# Patient Record
Sex: Female | Born: 1985 | Race: White | Hispanic: No | Marital: Married | Smoking: Never smoker
Health system: Southern US, Community
[De-identification: ages and names within clinical notes are randomized; demographics above are authoritative.]

## PROBLEM LIST (undated history)

## (undated) DIAGNOSIS — B009 Herpesviral infection, unspecified: Secondary | ICD-10-CM

## (undated) DIAGNOSIS — R87629 Unspecified abnormal cytological findings in specimens from vagina: Secondary | ICD-10-CM

## (undated) HISTORY — DX: Herpesviral infection, unspecified: B00.9

## (undated) HISTORY — DX: Unspecified abnormal cytological findings in specimens from vagina: R87.629

---

## 2019-09-20 ENCOUNTER — Other Ambulatory Visit: Payer: Self-pay

## 2019-09-20 ENCOUNTER — Ambulatory Visit (INDEPENDENT_AMBULATORY_CARE_PROVIDER_SITE_OTHER): Payer: BC Managed Care – PPO | Admitting: Family Medicine

## 2019-09-20 ENCOUNTER — Encounter: Payer: Self-pay | Admitting: Family Medicine

## 2019-09-20 ENCOUNTER — Ambulatory Visit: Payer: Self-pay

## 2019-09-20 DIAGNOSIS — M25562 Pain in left knee: Secondary | ICD-10-CM

## 2019-09-20 NOTE — Progress Notes (Signed)
   Office Visit Note   Patient: Regina Larsen           Date of Birth: [birth year]           MRN: [id number] Visit Date: 09/20/2019 Requested by: No referring provider defined for this encounter. PCP: No primary care provider on file.  Subjective: Chief Complaint  Patient presents with  . Left Knee - Pain    DOI 08/04/19 Felt a pop in the knee while doing a twisting motion in [employer] jiujitsu class. 24 hours later, she had trouble flexing the knee due to swelling. Been going to [facility]. They referred her here to be sure she didn't tear anything.    HPI: He is seen at the request of Aart Schulenklopper for left knee pain.  Roughly 6 weeks ago he was doing jujitsu class and had her weight planted on her left leg, she pivoted around and felt something pop in her left knee.  It hurt but not severely, she tried to do a little bit more in class but the knee gave out twice so she stopped.  Over the next day it swelled but not severely.  After couple weeks she was improving but still could not fully extend or fully flex her knee compared to the right.  She started going to physical therapy and is improving from a pain standpoint, but still not back to normal.  Her therapist was concerned about possible ACL tear so she now presents for evaluation.  No previous problems with her knee.  She is otherwise been in good health.              ROS: No fevers.  All other systems were reviewed and are negative.  Objective: Vital Signs: There were no vitals taken for this visit.  Physical Exam:  General:  Alert and oriented, in no acute distress. Pulm:  Breathing unlabored. Psy:  Normal mood, congruent affect. Skin:  No bruising.  Left knee: Trace effusion, no warmth.  She lacks about 5 degrees from full extension compared to the right knee.  Flexion is limited to just past 90 degrees.  No patellofemoral crepitus, negative patellar apprehension test.  Lockman's is difficult, but she seems to have laxity  with anterior drawer compared to the right.  She is also tender on the medial joint line with pain but no palpable click on McMurray's.  Imaging: X-rays left knee: Well-preserved joint spaces, no Segond fracture.   Assessment & Plan: 1.  6 weeks status post left knee injury with possible ACL tear, medial meniscus tear -MRI to evaluate.     Procedures: No procedures performed  No notes on file     PMFS History: There are no active problems to display for this patient.  History reviewed. No pertinent past medical history.  History reviewed. No pertinent family history.  History reviewed. No pertinent surgical history. Social History   Occupational History  . Not on file  Tobacco Use  . Smoking status: Not on file  Substance and Sexual Activity  . Alcohol use: Not on file  . Drug use: Not on file  . Sexual activity: Not on file

## 2019-09-30 ENCOUNTER — Other Ambulatory Visit: Payer: Self-pay

## 2019-09-30 ENCOUNTER — Other Ambulatory Visit: Payer: Self-pay | Admitting: Family Medicine

## 2019-09-30 ENCOUNTER — Ambulatory Visit
Admission: RE | Admit: 2019-09-30 | Discharge: 2019-09-30 | Disposition: A | Discharge status: Home / Self Care | Payer: BC Managed Care – PPO | Source: Ambulatory Visit | Attending: Family Medicine | Admitting: Family Medicine

## 2019-09-30 DIAGNOSIS — M25562 Pain in left knee: Secondary | ICD-10-CM

## 2019-10-03 ENCOUNTER — Telehealth: Payer: Self-pay | Admitting: Family Medicine

## 2019-10-03 NOTE — Telephone Encounter (Signed)
Advised the patient of her MRI results. Scheduled an appointment with Dr. Marlou Sa for surgical consult on 10/10/19 at 1:45.

## 2019-10-03 NOTE — Telephone Encounter (Signed)
Knee MRI scan shows intact meniscus cartilages, but the ACL appears damaged.  I recommend consultation with Dr. Marlou Sa to discuss surgical options.  Please schedule consult.

## 2019-10-03 NOTE — Telephone Encounter (Signed)
Left message on patient's home voice mail to call back for MRI results.

## 2019-10-05 ENCOUNTER — Other Ambulatory Visit: Payer: BC Managed Care – PPO

## 2019-10-10 ENCOUNTER — Ambulatory Visit (INDEPENDENT_AMBULATORY_CARE_PROVIDER_SITE_OTHER): Payer: BC Managed Care – PPO | Admitting: Orthopedic Surgery

## 2019-10-10 ENCOUNTER — Other Ambulatory Visit: Payer: Self-pay

## 2019-10-10 VITALS — Ht 64.5 in | Wt 230.0 lb

## 2019-10-10 DIAGNOSIS — S83512D Sprain of anterior cruciate ligament of left knee, subsequent encounter: Secondary | ICD-10-CM | POA: Diagnosis not present

## 2019-10-11 ENCOUNTER — Encounter: Payer: Self-pay | Admitting: Orthopedic Surgery

## 2019-10-11 NOTE — Progress Notes (Signed)
Office Visit Note   Patient: Regina Larsen           Date of Birth: [birth year]           MRN: [id number] Visit Date: 10/10/2019 Requested by: No referring provider defined for this encounter. PCP: Patient, No Pcp Per  Subjective: Chief Complaint  Patient presents with   Left Knee - Pain    HPI: Regina Larsen is a patient with left knee pain and instability.  She has seen Dr. Junius Roads in the past.  She is had an MRI scan.  MRI scan is reviewed and she has intact menisci but does have what appears to be ACL tear and degeneration.  She describes symptomatic instability since her injury several months ago.  She had the initial injury and it swelled about 24 hours later.  She does jujitsu.  Went to physical therapy but had continued episodes of instability when she tried returning to [employer].  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  No personal or family history of DVT or pulmonary embolism              ROS: All systems reviewed are negative as they relate to the chief complaint within the history of present illness.  Patient denies  fevers or chills.   Assessment & Plan: Visit Diagnoses:  1. Rupture of anterior cruciate ligament of left knee, subsequent encounter     Plan: Impression is left knee pain ACL instability following an injury with failure of conservative management in a patient who wants to be very active in mixed martial arts.  Discussed with her operative and nonoperative treatment options today.  Operative treatment options would include ACL reconstruction using autograft hamstring bone patella tendon bone or quad tendon.  She does spend a lot of time on her knees in the mixed martial arts and thus I think bone patella tendon bone would not be ideal.  Hamstring or quad tendon could also be effective for this.  Plan on autograft ACL reconstruction.  Risk benefits are discussed include not limited to infection nerve vessel damage knee  stiffness incomplete restoration of motion and function.  Patient understands the 65-month rehab time to get back to straightahead running and 22-month return to cutting and pivoting activities.  All questions answered.  Follow-Up Instructions: No follow-ups on file.   Orders:  No orders of the defined types were placed in this encounter.  No orders of the defined types were placed in this encounter.     Procedures: No procedures performed   Clinical Data: No additional findings.  Objective: Vital Signs: Ht 5' 4.5" (1.638 m)    Wt 230 lb (104.3 kg)    BMI 38.87 kg/m   Physical Exam:   Constitutional: Patient appears well-developed HEENT:  Head: Normocephalic Eyes:EOM are normal Neck: Normal range of motion Cardiovascular: Normal rate Pulmonary/chest: Effort normal Neurologic: Patient is alert Skin: Skin is warm Psychiatric: Patient has normal mood and affect    Ortho Exam: Ortho exam demonstrates increased anterior laxity with Lachman and anterior drawer left versus right.  Patient lacks about 5 degrees of full extension on the left compared to the right which does hyperextend.  Collaterals are stable on the left and there is no posterior lateral rotatory instability.  No effusion in the left knee.  No focal joint line tenderness.  Extensor mechanism is intact and flexion is symmetric to about 130 in both  knees.  Specialty Comments:  No specialty comments available.  Imaging: No results found.   PMFS History: There are no problems to display for this patient.  History reviewed. No pertinent past medical history.  History reviewed. No pertinent family history.  History reviewed. No pertinent surgical history. Social History   Occupational History   Not on file  Tobacco Use   Smoking status: Not on file  Substance and Sexual Activity   Alcohol use: Not on file   Drug use: Not on file   Sexual activity: Not on file

## 2019-10-28 HISTORY — PX: KNEE ARTHROSCOPY W/ ACL RECONSTRUCTION: SHX1858

## 2019-11-07 ENCOUNTER — Other Ambulatory Visit: Payer: Self-pay | Admitting: Surgical

## 2019-11-07 ENCOUNTER — Encounter: Payer: Self-pay | Admitting: Orthopedic Surgery

## 2019-11-07 DIAGNOSIS — S83512A Sprain of anterior cruciate ligament of left knee, initial encounter: Secondary | ICD-10-CM | POA: Diagnosis not present

## 2019-11-07 MED ORDER — ASPIRIN EC 81 MG PO TBEC: 81.0000 mg | DELAYED_RELEASE_TABLET | Freq: Every day | ORAL | 0 refills | Status: DC

## 2019-11-07 MED ORDER — OXYCODONE HCL 5 MG PO TABS: 5.0000 mg | ORAL_TABLET | ORAL | 0 refills | Status: AC | PRN

## 2019-11-07 MED ORDER — METHOCARBAMOL 500 MG PO TABS: 500.0000 mg | ORAL_TABLET | Freq: Three times a day (TID) | ORAL | 0 refills | Status: DC | PRN

## 2019-11-08 ENCOUNTER — Telehealth: Payer: Self-pay

## 2019-11-08 NOTE — Telephone Encounter (Signed)
Please advise. Thanks.  

## 2019-11-08 NOTE — Telephone Encounter (Signed)
Patient would like to know if she is to wear the immobilizer when walking around or just when she is in bed? 2)Is it okay to take Tylenol while taking Oxycodone? 3)Inside of right calf still feels numb, like pins and needles.  Is this normal? 4)Pulsing above the left knee.  Is this normal? Patient had Left knee surgery on Monday, 11/07/2019.  Cb# [phone].  Please advise.  Thank you.

## 2019-11-09 NOTE — Telephone Encounter (Signed)
I called and sw pt to advise of message below. Voiced understanding and to call back with any questions.

## 2019-11-16 ENCOUNTER — Encounter: Payer: Self-pay | Admitting: Orthopedic Surgery

## 2019-11-16 ENCOUNTER — Ambulatory Visit (INDEPENDENT_AMBULATORY_CARE_PROVIDER_SITE_OTHER): Payer: BC Managed Care – PPO | Admitting: Orthopedic Surgery

## 2019-11-16 ENCOUNTER — Other Ambulatory Visit: Payer: Self-pay

## 2019-11-16 DIAGNOSIS — S83512D Sprain of anterior cruciate ligament of left knee, subsequent encounter: Secondary | ICD-10-CM

## 2019-11-18 ENCOUNTER — Encounter: Payer: Self-pay | Admitting: Orthopedic Surgery

## 2019-11-18 NOTE — Progress Notes (Signed)
   Post-Op Visit Note   Patient: Regina Larsen           Date of Birth: 01/10/1986           MRN: [id number] Visit Date: 11/16/2019 PCP: Patient, No Pcp Per   Assessment & Plan:  Chief Complaint:  Chief Complaint  Patient presents with  . Left Knee - Pain, Follow-up, Routine Post Op   Visit Diagnoses:  1. Rupture of anterior cruciate ligament of left knee, subsequent encounter     Plan: Patient is a 34 year old female who presents s/p left knee ACL reconstruction on 11/07/2019.  She is doing well and ambulating with a cane.  She has stopped taking her oxycodone and is only taking the prescribed aspirin for DVT prophylaxis and Tylenol for pain.  She has been using her knee immobilizer brace.  She is also been using the CPM machine as directed and is at 90 degrees.  She has not had any physical therapy yet.  Her stitches are intact and were removed today.  Her incisions are healing well.  She does have a small effusion but did not opt for aspiration.  No calf tenderness.  Negative Homans' sign.  ACL graft is stable on exam today.  Plan to begin physical therapy at [facility] physical therapy.  Follow-up in 2 weeks for clinical recheck.  Follow-Up Instructions: No follow-ups on file.   Orders:  No orders of the defined types were placed in this encounter.  No orders of the defined types were placed in this encounter.   Imaging: No results found.  PMFS History: There are no problems to display for this patient.  History reviewed. No pertinent past medical history.  History reviewed. No pertinent family history.  History reviewed. No pertinent surgical history. Social History   Occupational History  . Not on file  Tobacco Use  . Smoking status: Never Smoker  . Smokeless tobacco: Never Used  Substance and Sexual Activity  . Alcohol use: Not on file  . Drug use: Not on file  . Sexual activity: Not on file

## 2019-11-19 ENCOUNTER — Encounter: Payer: Self-pay | Admitting: Orthopedic Surgery

## 2019-11-29 ENCOUNTER — Other Ambulatory Visit: Payer: Self-pay | Admitting: Surgical

## 2019-11-29 NOTE — Telephone Encounter (Signed)
Ok to rf? 

## 2019-11-30 ENCOUNTER — Encounter: Payer: Self-pay | Admitting: Orthopedic Surgery

## 2019-11-30 ENCOUNTER — Ambulatory Visit (INDEPENDENT_AMBULATORY_CARE_PROVIDER_SITE_OTHER): Payer: BC Managed Care – PPO | Admitting: Orthopedic Surgery

## 2019-11-30 ENCOUNTER — Other Ambulatory Visit: Payer: Self-pay

## 2019-11-30 DIAGNOSIS — S83512D Sprain of anterior cruciate ligament of left knee, subsequent encounter: Secondary | ICD-10-CM

## 2019-12-02 ENCOUNTER — Encounter: Payer: Self-pay | Admitting: Orthopedic Surgery

## 2019-12-02 NOTE — Progress Notes (Signed)
   Post-Op Visit Note   Patient: Regina Larsen           Date of Birth: Mar 23, 1986           MRN: [id number] Visit Date: 11/30/2019 PCP: Patient, No Pcp Per   Assessment & Plan:  Chief Complaint:  Chief Complaint  Patient presents with  . Left Knee - Pain   Visit Diagnoses:  1. Rupture of anterior cruciate ligament of left knee, subsequent encounter     Plan: Patient is a 34 year old female who presents s/p left knee ACL reconstruction on 11/07/2019.  Patient notes that she is doing well overall.  She is going to physical therapy twice a week where they are working on balancing, calf strength, quad strength.  Her main complaint is that she cannot straighten her left knee all the way.  She has 105 degrees of flexion but only 10 degrees of extension.  She has a large effusion on exam.  Her graft is stable and her incisions are healing well.  She has discontinued her CPM machine.  She notes some pain but overall is doing well.  She is taking Advil as needed.  I am a little concerned about her lack of full extension at this time.  She does have a soft endpoint so I think that she can improve on this front.  Aspirated the left knee of about 33 cc of fluid.  Also patient was given a small heel lift for her right shoe which should force her to fully extend her leg when walking.  I also instructed patient on how to best work on knee extension.  Plan for return office visit in about 4 weeks for clinical recheck.  Follow-Up Instructions: No follow-ups on file.   Orders:  No orders of the defined types were placed in this encounter.  No orders of the defined types were placed in this encounter.   Imaging: No results found.  PMFS History: There are no problems to display for this patient.  History reviewed. No pertinent past medical history.  History reviewed. No pertinent family history.  History reviewed. No pertinent surgical history. Social History   Occupational History  . Not on file   Tobacco Use  . Smoking status: Never Smoker  . Smokeless tobacco: Never Used  Substance and Sexual Activity  . Alcohol use: Not on file  . Drug use: Not on file  . Sexual activity: Not on file

## 2019-12-10 ENCOUNTER — Ambulatory Visit: Payer: BC Managed Care – PPO

## 2019-12-13 ENCOUNTER — Other Ambulatory Visit: Payer: Self-pay | Admitting: Surgical

## 2019-12-13 NOTE — Telephone Encounter (Signed)
Pls advise. Thanks.  

## 2019-12-28 ENCOUNTER — Ambulatory Visit (INDEPENDENT_AMBULATORY_CARE_PROVIDER_SITE_OTHER): Payer: BC Managed Care – PPO | Admitting: Orthopedic Surgery

## 2019-12-28 ENCOUNTER — Other Ambulatory Visit: Payer: Self-pay

## 2019-12-28 DIAGNOSIS — S83512D Sprain of anterior cruciate ligament of left knee, subsequent encounter: Secondary | ICD-10-CM

## 2019-12-29 ENCOUNTER — Encounter: Payer: Self-pay | Admitting: Orthopedic Surgery

## 2019-12-29 NOTE — Progress Notes (Signed)
   Post-Op Visit Note   Patient: Regina Larsen           Date of Birth: Apr 29, 1986           MRN: [id number] Visit Date: 12/28/2019 PCP: Patient, No Pcp Per   Assessment & Plan:  Chief Complaint:  Chief Complaint  Patient presents with  . Left Knee - Routine Post Op   Visit Diagnoses:  1. Rupture of anterior cruciate ligament of left knee, subsequent encounter     Plan: Regina Larsen is a patient with left knee ACL reconstruction performed 1121.  Doing well.  Going to therapy twice a week working on full extension.  Having a lot of pain.  Doing the elliptical and bike.  On exam graft is stable.  She does lack about 3 degrees of full extension.  Right leg hyperextends.  Mild effusion is present.  Graft is stable.  2 months return in really just stay the course until then.  We could begin some cutting and pivoting and sport specific activities at that time.  No calf tenderness today.  Negative Homans  Follow-Up Instructions: Return in about 8 weeks (around 02/22/2020).   Orders:  No orders of the defined types were placed in this encounter.  No orders of the defined types were placed in this encounter.   Imaging: No results found.  PMFS History: There are no problems to display for this patient.  History reviewed. No pertinent past medical history.  History reviewed. No pertinent family history.  History reviewed. No pertinent surgical history. Social History   Occupational History  . Not on file  Tobacco Use  . Smoking status: Never Smoker  . Smokeless tobacco: Never Used  Substance and Sexual Activity  . Alcohol use: Not on file  . Drug use: Not on file  . Sexual activity: Not on file

## 2020-02-27 ENCOUNTER — Telehealth: Payer: Self-pay | Admitting: Orthopedic Surgery

## 2020-02-27 NOTE — Telephone Encounter (Signed)
Printout of charges emailed to patient

## 2020-02-29 ENCOUNTER — Ambulatory Visit (INDEPENDENT_AMBULATORY_CARE_PROVIDER_SITE_OTHER): Payer: BC Managed Care – PPO | Admitting: Orthopedic Surgery

## 2020-02-29 ENCOUNTER — Other Ambulatory Visit: Payer: Self-pay

## 2020-02-29 ENCOUNTER — Encounter: Payer: Self-pay | Admitting: Orthopedic Surgery

## 2020-02-29 DIAGNOSIS — S83512D Sprain of anterior cruciate ligament of left knee, subsequent encounter: Secondary | ICD-10-CM

## 2020-02-29 NOTE — Progress Notes (Signed)
   Post-Op Visit Note   Patient: Regina Larsen           Date of Birth: October 10, 1986           MRN: [id number] Visit Date: 02/29/2020 PCP: Patient, No Pcp Per   Assessment & Plan:  Chief Complaint:  Chief Complaint  Patient presents with  . Left Knee - Follow-up   Visit Diagnoses:  1. Rupture of anterior cruciate ligament of left knee, subsequent encounter     Plan: Patient is a 34 year old female presents s/p ACL reconstruction on 11/07/2019.  She is about 4 months out.  She is doing great and has no complaints of pain.  She is going to physical therapy 2 times a week where they are using blood flow restriction therapy as well as working on weight lifting, elliptical, running drills, proprioception.  She is not taking any medications for pain.  She does not wake up with pain at night.  She has no complaints of instability.  On exam she has 0 degrees of extension as well as flexion that is equivalent to the contralateral leg.  Her graft feels stable on exam.  She has no effusion.  Incisions of healed well.  She has excellent quadriceps and hamstring strength but her hamstring strength is not quite equivalent to the contralateral leg at this point.  She wants [place]  Recommend that she hold off on any cutting or pivoting or martial arts specific training at this time.  Will discuss return to sport at her next visit in 2 months.  Patient agrees with plan.  Follow-Up Instructions: No follow-ups on file.   Orders:  No orders of the defined types were placed in this encounter.  No orders of the defined types were placed in this encounter.   Imaging: No results found.  PMFS History: There are no problems to display for this patient.  No past medical history on file.  No family history on file.  No past surgical history on file. Social History   Occupational History  . Not on file  Tobacco Use  . Smoking status: Never Smoker  .  Smokeless tobacco: Never Used  Substance and Sexual Activity  . Alcohol use: Not on file  . Drug use: Not on file  . Sexual activity: Not on file

## 2020-05-02 ENCOUNTER — Ambulatory Visit (INDEPENDENT_AMBULATORY_CARE_PROVIDER_SITE_OTHER): Payer: BC Managed Care – PPO | Admitting: Orthopedic Surgery

## 2020-05-02 DIAGNOSIS — S83512D Sprain of anterior cruciate ligament of left knee, subsequent encounter: Secondary | ICD-10-CM

## 2020-05-06 ENCOUNTER — Encounter: Payer: Self-pay | Admitting: Orthopedic Surgery

## 2020-05-06 NOTE — Progress Notes (Signed)
Office Visit Note   Patient: Regina Larsen           Date of Birth: [birth year]           MRN: [id number] Visit Date: 05/02/2020 Requested by: No referring provider defined for this encounter. PCP: Patient, No Pcp Per  Subjective: Chief Complaint  Patient presents with  . Follow-up    HPI: Halima Fogal is a 34 y.o. female who presents to the office s/p left knee ACL reconstruction on 11/07/2019.  She is doing well with no complaints.  She is doing home exercise program and going to the gym.  She denies any instability or mechanical symptoms.  She does have occasional pain but nothing that requires taking any oral medication for.  Her main goal is to return to jujitsu..                ROS: All systems reviewed are negative as they relate to the chief complaint within the history of present illness.  Patient denies fevers or chills.  Assessment & Plan: Visit Diagnoses: No diagnosis found.  Plan: Patient is a 35 year old female who presents about 6 months out from left knee ACL reconstruction.  She is doing well with no problems.  She has had no instability events.  She does have occasional pain but nothing that is significant to her.  On exam she has no effusion and her graft is stable on Lachman exam.  She is still lacking some hamstring strength which is expected at this point.  Recommend she continue to work on her hamstring strengthening.  Plan to continue with home exercise program and gym workouts.  Hold off on any jujitsu for now.  Plan to see patient back in 3 months from today which would make her 45-month out from procedure.  If she is still doing well at that point she can ease her self into returning to jujitsu classes.  Patient agreed this plan.  Follow-up in 3 months.  Follow-Up Instructions: No follow-ups on file.   Orders:  No orders of the defined types were placed in this encounter.  No orders of the defined types were placed in this encounter.     Procedures: No  procedures performed   Clinical Data: No additional findings.  Objective: Vital Signs: There were no vitals taken for this visit.  Physical Exam:  Constitutional: Patient appears well-developed HEENT:  Head: Normocephalic Eyes:EOM are normal Neck: Normal range of motion Cardiovascular: Normal rate Pulmonary/chest: Effort normal Neurologic: Patient is alert Skin: Skin is warm Psychiatric: Patient has normal mood and affect  Ortho Exam: Ortho exam demonstrates left knee with excellent range of motion.  0 degrees extension.  Greater than 120 degrees of flexion.  No effusion is present.  No tenderness to palpation throughout the medial or lateral joint lines.  Incisions are well-healed.  Graft is stable on Lachman exam with a solid endpoint and no significant laxity.  She has equal strength between the right and left quadriceps muscles.  She is lacking some hamstring strength compared with the nonoperative side.  Specialty Comments:  No specialty comments available.  Imaging: No results found.   PMFS History: There are no problems to display for this patient.  No past medical history on file.  No family history on file.  No past surgical history on file. Social History   Occupational History  . Not on file  Tobacco Use  . Smoking status: Never Smoker  . Smokeless tobacco: Never Used  Substance and Sexual Activity  . Alcohol use: Not on file  . Drug use: Not on file  . Sexual activity: Not on file

## 2020-08-02 ENCOUNTER — Ambulatory Visit: Payer: BC Managed Care – PPO | Admitting: Orthopedic Surgery

## 2020-08-09 ENCOUNTER — Ambulatory Visit: Payer: Self-pay

## 2020-08-09 ENCOUNTER — Ambulatory Visit (INDEPENDENT_AMBULATORY_CARE_PROVIDER_SITE_OTHER): Payer: BC Managed Care – PPO | Admitting: Orthopedic Surgery

## 2020-08-09 DIAGNOSIS — S83512D Sprain of anterior cruciate ligament of left knee, subsequent encounter: Secondary | ICD-10-CM

## 2020-08-09 DIAGNOSIS — M79671 Pain in right foot: Secondary | ICD-10-CM | POA: Diagnosis not present

## 2020-08-11 NOTE — Progress Notes (Signed)
   Office Visit Note   Patient: Regina Larsen           Date of Birth: [birth year]           MRN: [id number] Visit Date: 08/09/2020 Requested by: No referring provider defined for this encounter. PCP: Patient, No Pcp Per  Subjective: Chief Complaint  Patient presents with  . Follow-up    HPI: Regina Larsen is a 35 y.o. female who presents to the office s/p left knee ACL reconstruction on 11/07/2019. Patient notes that she is doing well overall aside from some occasional "tightness" that bothers her in colder weather. She is going to the gym where she is working with a trainer for agility exercises as well as using stationary bike and doing some strength training exercises. She notes occasional giving way of the knee for a brief second but this is only happened 2-3 times since her last office visit and overall she feels that it is not a major concern. She does note some continued mild right foot pain over the last several months. She has been using stretching exercises to work on the plantar pain which has been helping but she also notes lateral sided pain around the fifth metatarsal base that has bothered her more in the last week. On exam her left knee is stable on Lachman exam without any effusion. She has excellent quadricep strength at about 85% hamstring strength of the contralateral side. Incisions of healed well.  Plan for patient to continue with her agility exercises and exercise regimen and she may start to gradually return to presenting jujitsu as she desires. Regarding the right foot, radiographs are negative for any acute pathology noted. Recommend she continue with anti-inflammatories and stretching exercises as it seems to be helping with her plantar foot pain. Follow-up as needed..                ROS: All systems reviewed are negative as they relate to the chief complaint within the history of present illness.  Patient denies fevers or chills.  Assessment & Plan: Visit Diagnoses:    1. Right foot pain   2. Rupture of anterior cruciate ligament of left knee, subsequent encounter     Plan: See above.  Follow-Up Instructions: No follow-ups on file.   Orders:  Orders Placed This Encounter  Procedures  . XR Foot Complete Right   No orders of the defined types were placed in this encounter.     Procedures: No procedures performed   Clinical Data: No additional findings.  Objective: Vital Signs: There were no vitals taken for this visit.  Physical Exam:  Constitutional: Patient appears well-developed HEENT:  Head: Normocephalic Eyes:EOM are normal Neck: Normal range of motion Cardiovascular: Normal rate Pulmonary/chest: Effort normal Neurologic: Patient is alert Skin: Skin is warm Psychiatric: Patient has normal mood and affect  Ortho Exam: See above.  Specialty Comments:  No specialty comments available.  Imaging: No results found.   PMFS History: There are no problems to display for this patient.  No past medical history on file.  No family history on file.  No past surgical history on file. Social History   Occupational History  . Not on file  Tobacco Use  . Smoking status: Never Smoker  . Smokeless tobacco: Never Used  Substance and Sexual Activity  . Alcohol use: Not on file  . Drug use: Not on file  . Sexual activity: Not on file

## 2020-08-12 ENCOUNTER — Encounter: Payer: Self-pay | Admitting: Orthopedic Surgery

## 2020-10-08 LAB — OB RESULTS CONSOLE RPR: RPR: NONREACTIVE

## 2020-10-08 LAB — OB RESULTS CONSOLE GC/CHLAMYDIA
Chlamydia: NEGATIVE
Gonorrhea: NEGATIVE

## 2020-10-08 LAB — OB RESULTS CONSOLE HEPATITIS B SURFACE ANTIGEN: Hepatitis B Surface Ag: NEGATIVE

## 2020-10-08 LAB — OB RESULTS CONSOLE ABO/RH: RH Type: POSITIVE

## 2020-10-08 LAB — OB RESULTS CONSOLE RUBELLA ANTIBODY, IGM: Rubella: IMMUNE

## 2020-10-08 LAB — OB RESULTS CONSOLE HIV ANTIBODY (ROUTINE TESTING): HIV: NONREACTIVE

## 2020-10-08 LAB — OB RESULTS CONSOLE ANTIBODY SCREEN: Antibody Screen: NEGATIVE

## 2020-10-27 NOTE — L&D Delivery Note (Signed)
PROCEDURE DATE: 05/07/21   PREOPERATIVE DIAGNOSIS: Preeclampsia with severe features, nonreassuring fetal status, vaginal bleeding, failed induction   POSTOPERATIVE DIAGNOSIS: The same   PROCEDURE:    Primary Low Transverse Cesarean Section   SURGEON:  Dr. Austin Lavere Stork   INDICATIONS: This is a 35 yo G1P0 at 40 wga that was being induced for preeclampsia with severe features (severe range BP).  She required multiple rounds of IV antihypertensives and was on magnesium infusion. She underwent 2 stage IOL and progressed slowly. Her labor course was complicated by nonreassuring fetal status.  Decision made to proceed with LTCS. The risks of cesarean section discussed with the patient included but were not limited to: bleeding which may require transfusion or reoperation; infection which may require antibiotics; injury to bowel, bladder, ureters or other surrounding organs; injury to the fetus; need for additional procedures including hysterectomy in the event of a life-threatening hemorrhage; placental abnormalities wth subsequent pregnancies, incisional problems, thromboembolic phenomenon and other postoperative/anesthesia complications. The patient agreed with the proposed plan, giving informed consent for the procedure.     FINDINGS:  Viable female infant in vertex presentation, occiput posterior, APGARs8, 9,  Weight pending, Amniotic fluid meconium stained,  Intact placenta, three vessel cord with known marginal cord insertion.  Grossly normal uterus. .   ANESTHESIA:    Epidural ESTIMATED BLOOD LOSS: 500 cc SPECIMENS: Placenta for pathology (possible abruption) COMPLICATIONS: None immediate    PROCEDURE IN DETAIL:  The patient received intravenous antibiotics (2g Ancef) and had sequential compression devices applied to her lower extremities while in the preoperative area.  She was then taken to the operating room where epidural anesthesia was dosed up to surgical level and was found to be  adequate. She was then placed in a dorsal supine position with a leftward tilt, and prepped and draped in a sterile manner.  A foley catheter was placed into her bladder and attached to constant gravity.  After an adequate timeout was performed, a Pfannenstiel skin incision was made with scalpel and carried through to the underlying layer of fascia. The fascia was incised in the midline and this incision was extended bilaterally using the Mayo scissors. Kocher clamps were applied to the superior aspect of the fascial incision and the underlying rectus muscles were dissected off bluntly. A similar process was carried out on the inferior aspect of the facial incision. The rectus muscles were separated in the midline bluntly and the peritoneum was entered bluntly.  A bladder flap was created sharply and developed bluntly. A transverse hysterotomy was made with a scalpel and extended bilaterally bluntly. The bladder blade was then removed. The infant was successfully delivered, and cord was clamped and cut and infant was handed over to awaiting neonatology team. Uterine massage was then administered and the placenta delivered intact with three-vessel cord. Cord gases were taken. The uterus was cleared of clot and debris.  The hysterotomy was closed with 0 vicryl. The fascia was closed with 0-PDS in a running fashion with good restoration of anatomy.  The subcutaneus tissue was irrigated and was reapproximated using running plain gut stitches.  The skin was closed with 4-0 Vicryl in a subcuticular fashion.  All surgical site and was hemostatic at end of procedure without any further bleeding on exam.    Pt tolerated the procedure well. All sponge/lap/needle counts were correct  X 2. Pt taken to recovery room in stable condition.   Austin Samay Delcarlo MD 

## 2021-01-07 ENCOUNTER — Encounter: Payer: Self-pay | Admitting: Obstetrics and Gynecology

## 2021-01-10 ENCOUNTER — Other Ambulatory Visit: Payer: Self-pay | Admitting: Obstetrics and Gynecology

## 2021-01-10 DIAGNOSIS — O43122 Velamentous insertion of umbilical cord, second trimester: Secondary | ICD-10-CM

## 2021-01-11 ENCOUNTER — Other Ambulatory Visit: Payer: Self-pay | Admitting: Obstetrics and Gynecology

## 2021-01-24 ENCOUNTER — Encounter: Payer: Self-pay | Admitting: *Deleted

## 2021-01-28 ENCOUNTER — Ambulatory Visit: Payer: BC Managed Care – PPO | Admitting: *Deleted

## 2021-01-28 ENCOUNTER — Encounter: Payer: Self-pay | Admitting: *Deleted

## 2021-01-28 ENCOUNTER — Ambulatory Visit: Payer: BC Managed Care – PPO | Attending: Obstetrics and Gynecology

## 2021-01-28 ENCOUNTER — Other Ambulatory Visit: Payer: Self-pay

## 2021-01-28 ENCOUNTER — Other Ambulatory Visit: Payer: Self-pay | Admitting: Obstetrics and Gynecology

## 2021-01-28 ENCOUNTER — Other Ambulatory Visit: Payer: Self-pay | Admitting: *Deleted

## 2021-01-28 VITALS — BP 114/69 | HR 92

## 2021-01-28 DIAGNOSIS — O43122 Velamentous insertion of umbilical cord, second trimester: Secondary | ICD-10-CM

## 2021-01-28 DIAGNOSIS — O321XX0 Maternal care for breech presentation, not applicable or unspecified: Secondary | ICD-10-CM | POA: Diagnosis not present

## 2021-01-28 DIAGNOSIS — Z363 Encounter for antenatal screening for malformations: Secondary | ICD-10-CM

## 2021-01-28 DIAGNOSIS — O4402 Placenta previa specified as without hemorrhage, second trimester: Secondary | ICD-10-CM

## 2021-01-28 DIAGNOSIS — Z3A26 26 weeks gestation of pregnancy: Secondary | ICD-10-CM

## 2021-01-28 DIAGNOSIS — O09812 Supervision of pregnancy resulting from assisted reproductive technology, second trimester: Secondary | ICD-10-CM

## 2021-01-28 DIAGNOSIS — O99212 Obesity complicating pregnancy, second trimester: Secondary | ICD-10-CM

## 2021-03-26 ENCOUNTER — Ambulatory Visit: Payer: BC Managed Care – PPO

## 2021-04-12 LAB — OB RESULTS CONSOLE GBS: GBS: NEGATIVE

## 2021-04-30 ENCOUNTER — Telehealth (HOSPITAL_COMMUNITY): Payer: Self-pay | Admitting: *Deleted

## 2021-04-30 NOTE — Telephone Encounter (Signed)
Preadmission screen  

## 2021-05-02 ENCOUNTER — Telehealth (HOSPITAL_COMMUNITY): Payer: Self-pay | Admitting: *Deleted

## 2021-05-02 ENCOUNTER — Encounter (HOSPITAL_COMMUNITY): Payer: Self-pay | Admitting: *Deleted

## 2021-05-02 NOTE — Telephone Encounter (Signed)
Preadmission screen  

## 2021-05-05 ENCOUNTER — Other Ambulatory Visit: Payer: Self-pay

## 2021-05-05 ENCOUNTER — Encounter (HOSPITAL_COMMUNITY): Payer: Self-pay | Admitting: Obstetrics and Gynecology

## 2021-05-05 ENCOUNTER — Inpatient Hospital Stay (HOSPITAL_COMMUNITY)
Admission: AD | Admit: 2021-05-05 | Discharge: 2021-05-09 | DRG: 787 | Disposition: A | Discharge status: Home / Self Care | Payer: BC Managed Care – PPO | Attending: Obstetrics and Gynecology | Admitting: Obstetrics and Gynecology

## 2021-05-05 DIAGNOSIS — O1410 Severe pre-eclampsia, unspecified trimester: Secondary | ICD-10-CM | POA: Diagnosis present

## 2021-05-05 DIAGNOSIS — Z3A40 40 weeks gestation of pregnancy: Secondary | ICD-10-CM

## 2021-05-05 DIAGNOSIS — A6000 Herpesviral infection of urogenital system, unspecified: Secondary | ICD-10-CM | POA: Diagnosis present

## 2021-05-05 DIAGNOSIS — O43123 Velamentous insertion of umbilical cord, third trimester: Secondary | ICD-10-CM | POA: Diagnosis present

## 2021-05-05 DIAGNOSIS — O26893 Other specified pregnancy related conditions, third trimester: Secondary | ICD-10-CM | POA: Diagnosis present

## 2021-05-05 DIAGNOSIS — O1414 Severe pre-eclampsia complicating childbirth: Secondary | ICD-10-CM | POA: Diagnosis present

## 2021-05-05 DIAGNOSIS — O9832 Other infections with a predominantly sexual mode of transmission complicating childbirth: Secondary | ICD-10-CM | POA: Diagnosis present

## 2021-05-05 LAB — TYPE AND SCREEN
ABO/RH(D): O POS
Antibody Screen: NEGATIVE

## 2021-05-05 LAB — PROTEIN / CREATININE RATIO, URINE
Creatinine, Urine: 94.4 mg/dL
Protein Creatinine Ratio: 3.62 mg/mg{Cre} — ABNORMAL HIGH (ref 0.00–0.15)
Total Protein, Urine: 342 mg/dL

## 2021-05-05 LAB — COMPREHENSIVE METABOLIC PANEL
ALT: 14 U/L (ref 0–44)
AST: 17 U/L (ref 15–41)
Albumin: 2.5 g/dL — ABNORMAL LOW (ref 3.5–5.0)
Alkaline Phosphatase: 121 U/L (ref 38–126)
Anion gap: 9 (ref 5–15)
BUN: 15 mg/dL (ref 6–20)
CO2: 21 mmol/L — ABNORMAL LOW (ref 22–32)
Calcium: 8.7 mg/dL — ABNORMAL LOW (ref 8.9–10.3)
Chloride: 105 mmol/L (ref 98–111)
Creatinine, Ser: 0.72 mg/dL (ref 0.44–1.00)
GFR, Estimated: >60 mL/min (ref >60)
Glucose, Bld: 87 mg/dL (ref 70–99)
Potassium: 4.3 mmol/L (ref 3.5–5.1)
Sodium: 135 mmol/L (ref 135–145)
Total Bilirubin: 0.5 mg/dL (ref 0.3–1.2)
Total Protein: 6.1 g/dL — ABNORMAL LOW (ref 6.5–8.1)

## 2021-05-05 LAB — CBC
HCT: 41.8 % (ref 36.0–46.0)
Hemoglobin: 14.1 g/dL (ref 12.0–15.0)
MCH: 28.6 pg (ref 26.0–34.0)
MCHC: 33.7 g/dL (ref 30.0–36.0)
MCV: 84.8 fL (ref 80.0–100.0)
Platelets: 287 10*3/uL (ref 150–400)
RBC: 4.93 MIL/uL (ref 3.87–5.11)
RDW: 14.3 % (ref 11.5–15.5)
WBC: 11.3 10*3/uL — ABNORMAL HIGH (ref 4.0–10.5)
nRBC: 0 % (ref 0.0–0.2)

## 2021-05-05 MED ORDER — LACTATED RINGERS IV SOLN: 500.0000 mL | INTRAVENOUS | Status: DC | PRN

## 2021-05-05 MED ORDER — OXYCODONE-ACETAMINOPHEN 5-325 MG PO TABS: 2.0000 | ORAL_TABLET | ORAL | Status: DC | PRN

## 2021-05-05 MED ORDER — MAGNESIUM SULFATE 40 GM/1000ML IV SOLN
2.0000 g/h | INTRAVENOUS | Status: DC
  Filled 2021-05-05 (×2): qty 1000

## 2021-05-05 MED ORDER — HYDRALAZINE HCL 20 MG/ML IJ SOLN
10.0000 mg | INTRAMUSCULAR | Status: DC | PRN
  Filled 2021-05-05: qty 1

## 2021-05-05 MED ORDER — MAGNESIUM SULFATE BOLUS VIA INFUSION
4.0000 g | Freq: Once | INTRAVENOUS | Status: AC
  Administered 2021-05-05: 4 g via INTRAVENOUS
  Filled 2021-05-05: qty 1000

## 2021-05-05 MED ORDER — TERBUTALINE SULFATE 1 MG/ML IJ SOLN: 0.2500 mg | Freq: Once | INTRAMUSCULAR | Status: DC | PRN

## 2021-05-05 MED ORDER — LABETALOL HCL 5 MG/ML IV SOLN
40.0000 mg | INTRAVENOUS | Status: DC | PRN
  Administered 2021-05-05 – 2021-05-06 (×2): 40 mg via INTRAVENOUS
  Filled 2021-05-05 (×3): qty 8

## 2021-05-05 MED ORDER — LABETALOL HCL 5 MG/ML IV SOLN
INTRAVENOUS | Status: AC
  Administered 2021-05-05: 20 mg via INTRAVENOUS
  Filled 2021-05-05: qty 4

## 2021-05-05 MED ORDER — LIDOCAINE HCL (PF) 1 % IJ SOLN: 30.0000 mL | INTRAMUSCULAR | Status: DC | PRN

## 2021-05-05 MED ORDER — OXYCODONE-ACETAMINOPHEN 5-325 MG PO TABS: 1.0000 | ORAL_TABLET | ORAL | Status: DC | PRN

## 2021-05-05 MED ORDER — LABETALOL HCL 5 MG/ML IV SOLN
20.0000 mg | INTRAVENOUS | Status: DC | PRN
  Administered 2021-05-06: 20 mg via INTRAVENOUS
  Filled 2021-05-05: qty 4

## 2021-05-05 MED ORDER — ONDANSETRON HCL 4 MG/2ML IJ SOLN
4.0000 mg | Freq: Four times a day (QID) | INTRAMUSCULAR | Status: DC | PRN
  Administered 2021-05-06 (×2): 4 mg via INTRAVENOUS
  Filled 2021-05-05 (×2): qty 2

## 2021-05-05 MED ORDER — LACTATED RINGERS IV SOLN
INTRAVENOUS | Status: DC
  Administered 2021-05-05: 1000 mL via INTRAVENOUS

## 2021-05-05 MED ORDER — OXYTOCIN-SODIUM CHLORIDE 30-0.9 UT/500ML-% IV SOLN
2.5000 [IU]/h | INTRAVENOUS | Status: DC
  Filled 2021-05-05: qty 500

## 2021-05-05 MED ORDER — LABETALOL HCL 5 MG/ML IV SOLN
80.0000 mg | INTRAVENOUS | Status: DC | PRN
  Administered 2021-05-05 – 2021-05-06 (×2): 80 mg via INTRAVENOUS
  Filled 2021-05-05 (×2): qty 16

## 2021-05-05 MED ORDER — SOD CITRATE-CITRIC ACID 500-334 MG/5ML PO SOLN
30.0000 mL | ORAL | Status: DC | PRN
  Administered 2021-05-06 – 2021-05-07 (×2): 30 mL via ORAL
  Filled 2021-05-05 (×3): qty 30

## 2021-05-05 MED ORDER — FENTANYL CITRATE (PF) 100 MCG/2ML IJ SOLN: 50.0000 ug | INTRAMUSCULAR | Status: DC | PRN

## 2021-05-05 MED ORDER — MISOPROSTOL 25 MCG QUARTER TABLET
25.0000 ug | ORAL_TABLET | ORAL | Status: DC | PRN
  Administered 2021-05-06 (×2): 25 ug via VAGINAL
  Filled 2021-05-05 (×2): qty 1

## 2021-05-05 MED ORDER — FLEET ENEMA 7-19 GM/118ML RE ENEM: 1.0000 | ENEMA | RECTAL | Status: DC | PRN

## 2021-05-05 MED ORDER — ACETAMINOPHEN 325 MG PO TABS
650.0000 mg | ORAL_TABLET | ORAL | Status: DC | PRN
  Administered 2021-05-06 – 2021-05-07 (×2): 650 mg via ORAL
  Filled 2021-05-05 (×2): qty 2

## 2021-05-05 MED ORDER — OXYTOCIN BOLUS FROM INFUSION: 333.0000 mL | Freq: Once | INTRAVENOUS | Status: DC

## 2021-05-05 NOTE — H&P (Signed)
Regina Larsen is a 35 y.o. female presenting for UCs. No ROM. Denies HA, denies vision change. 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 OB History     Gravida  1   Para      Term      Preterm      AB      Living         SAB      IAB      Ectopic      Multiple      Live Births             Past Medical History:  Diagnosis Date   HSV (herpes simplex virus) infection    Vaginal Pap smear, abnormal    Past Surgical History:  Procedure Laterality Date   KNEE ARTHROSCOPY W/ ACL RECONSTRUCTION  2021   Family History: family history includes Asthma in her sister; Cancer in her paternal grandfather; Diabetes in her father; Heart disease in her mother; Hypertension in her father; Obesity in her father; Stroke in her father and paternal grandmother. Social History:  reports that she has never smoked. She has never used smokeless tobacco. She reports previous alcohol use. She reports that she does not use drugs.     Maternal Diabetes: No Genetic Screening: Normal Maternal Ultrasounds/Referrals: Other:velamentous insertion of cord Fetal Ultrasounds or other Referrals:  None Maternal Substance Abuse:  No Significant Maternal Medications:  Meds include: Other: Valtrex Significant Maternal Lab Results:  Group B Strep negative Other Comments:  None  Review of Systems  Constitutional:  Negative for fever.  Eyes:  Negative for visual disturbance.  Gastrointestinal:  Negative for abdominal pain.  Neurological:  Negative for headaches.  Maternal Medical History:  Reason for admission: Contractions.   Fetal activity: Perceived fetal activity is normal.    Dilation: 1 Effacement (%): 90 Station: -2 Exam by:: D. Martin,RN/Lynette Weston RN Blood pressure (!) 157/102, pulse 81, temperature 98.3 F (36.8 C),  temperature source Oral, resp. rate 17, height 5\' 4"  (1.626 m), weight 122.9 kg, last menstrual period 03/19/2020, SpO2 98 %. Maternal Exam:  Abdomen: Patient reports no abdominal tenderness. Fetal presentation: vertex   Fetal Exam Fetal State Assessment: Category I - tracings are normal.  Physical Exam Cardiovascular:     Rate and Rhythm: Normal rate.  Pulmonary:     Effort: Pulmonary effort is normal.   DTR 2+ without clonus Abdomen NT  Cx 2/80/-2/vtx/ballotable/posterior UCs about q4 min  Prenatal labs: ABO, Rh: O/Positive/-- (12/13 0000) Antibody: Negative (12/13 0000) Rubella: Immune (12/13 0000) RPR: Nonreactive (12/13 0000)  HBsAg: Negative (12/13 0000)  HIV: Non-reactive (12/13 0000)  GBS: Negative/-- (06/17 0000)   Assessment/Plan: 35 yo G1P0 @ 40 2/7 weeks Preeclampsia with severe features based on severe range BP so far requiring IV labetalol x 1, labs pending D/W patient and husband Will start magnesium sulfate for seizure prophylaxis Will then proceed with IOL-possible two stage depending on uterine activity after magnesium bolus is in   4/7 II 05/05/2021, 11:03 PM

## 2021-05-05 NOTE — MAU Note (Signed)
Rt hip bump per MD-following sever range BP-BP retake in 5 minutes within range-2nd dose of Labetalol held-pt transferred to LD will start Magnesium infusion in LD

## 2021-05-05 NOTE — MAU Note (Signed)
Pt reports contractions and some bloody show. Reports good fetal movement.

## 2021-05-06 ENCOUNTER — Inpatient Hospital Stay (HOSPITAL_COMMUNITY): Payer: BC Managed Care – PPO | Admitting: Anesthesiology

## 2021-05-06 ENCOUNTER — Inpatient Hospital Stay (HOSPITAL_COMMUNITY)
Admission: AD | Admit: 2021-05-06 | Payer: BC Managed Care – PPO | Source: Home / Self Care | Admitting: Obstetrics & Gynecology

## 2021-05-06 ENCOUNTER — Encounter (HOSPITAL_COMMUNITY): Payer: Self-pay | Admitting: Obstetrics and Gynecology

## 2021-05-06 ENCOUNTER — Other Ambulatory Visit (HOSPITAL_COMMUNITY): Payer: BC Managed Care – PPO

## 2021-05-06 ENCOUNTER — Inpatient Hospital Stay (HOSPITAL_COMMUNITY): Payer: BC Managed Care – PPO

## 2021-05-06 LAB — COMPREHENSIVE METABOLIC PANEL
ALT: 13 U/L (ref 0–44)
AST: 19 U/L (ref 15–41)
Albumin: 2.3 g/dL — ABNORMAL LOW (ref 3.5–5.0)
Alkaline Phosphatase: 110 U/L (ref 38–126)
Anion gap: 10 (ref 5–15)
BUN: 12 mg/dL (ref 6–20)
CO2: 21 mmol/L — ABNORMAL LOW (ref 22–32)
Calcium: 8 mg/dL — ABNORMAL LOW (ref 8.9–10.3)
Chloride: 100 mmol/L (ref 98–111)
Creatinine, Ser: 0.8 mg/dL (ref 0.44–1.00)
GFR, Estimated: >60 mL/min (ref >60)
Glucose, Bld: 94 mg/dL (ref 70–99)
Potassium: 4.1 mmol/L (ref 3.5–5.1)
Sodium: 131 mmol/L — ABNORMAL LOW (ref 135–145)
Total Bilirubin: 0.2 mg/dL — ABNORMAL LOW (ref 0.3–1.2)
Total Protein: 5.6 g/dL — ABNORMAL LOW (ref 6.5–8.1)

## 2021-05-06 LAB — CBC
HCT: 37.8 % (ref 36.0–46.0)
HCT: 39.5 % (ref 36.0–46.0)
Hemoglobin: 13 g/dL (ref 12.0–15.0)
Hemoglobin: 13.7 g/dL (ref 12.0–15.0)
MCH: 29.1 pg (ref 26.0–34.0)
MCH: 29.1 pg (ref 26.0–34.0)
MCHC: 34.4 g/dL (ref 30.0–36.0)
MCHC: 34.7 g/dL (ref 30.0–36.0)
MCV: 84.6 fL (ref 80.0–100.0)
MCV: 84 fL (ref 80.0–100.0)
Platelets: 273 10*3/uL (ref 150–400)
Platelets: 283 10*3/uL (ref 150–400)
RBC: 4.47 MIL/uL (ref 3.87–5.11)
RBC: 4.7 MIL/uL (ref 3.87–5.11)
RDW: 14.2 % (ref 11.5–15.5)
RDW: 14.3 % (ref 11.5–15.5)
WBC: 11.5 10*3/uL — ABNORMAL HIGH (ref 4.0–10.5)
WBC: 12.6 10*3/uL — ABNORMAL HIGH (ref 4.0–10.5)
nRBC: 0 % (ref 0.0–0.2)
nRBC: 0 % (ref 0.0–0.2)

## 2021-05-06 LAB — MAGNESIUM: Magnesium: 4.3 mg/dL — ABNORMAL HIGH (ref 1.7–2.4)

## 2021-05-06 LAB — RPR: RPR Ser Ql: NONREACTIVE

## 2021-05-06 MED ORDER — PHENYLEPHRINE 40 MCG/ML (10ML) SYRINGE FOR IV PUSH (FOR BLOOD PRESSURE SUPPORT): 80.0000 ug | PREFILLED_SYRINGE | INTRAVENOUS | Status: DC | PRN

## 2021-05-06 MED ORDER — DIPHENHYDRAMINE HCL 50 MG/ML IJ SOLN: 12.5000 mg | INTRAMUSCULAR | Status: DC | PRN

## 2021-05-06 MED ORDER — EPHEDRINE 5 MG/ML INJ: 10.0000 mg | INTRAVENOUS | Status: DC | PRN

## 2021-05-06 MED ORDER — LIDOCAINE HCL (PF) 1 % IJ SOLN
INTRAMUSCULAR | Status: DC | PRN
Start: 2021-05-06 — End: 2021-05-07
  Administered 2021-05-06 (×2): 2 mL via EPIDURAL

## 2021-05-06 MED ORDER — BUTALBITAL-APAP-CAFFEINE 50-325-40 MG PO TABS
2.0000 | ORAL_TABLET | ORAL | Status: DC | PRN
  Administered 2021-05-06 (×2): 2 via ORAL
  Filled 2021-05-06 (×2): qty 2

## 2021-05-06 MED ORDER — FENTANYL-BUPIVACAINE-NACL 0.5-0.125-0.9 MG/250ML-% EP SOLN
12.0000 mL/h | EPIDURAL | Status: DC | PRN
  Administered 2021-05-06: 12 mL/h via EPIDURAL

## 2021-05-06 MED ORDER — FAMOTIDINE IN NACL 20-0.9 MG/50ML-% IV SOLN
20.0000 mg | Freq: Once | INTRAVENOUS | Status: AC
  Administered 2021-05-06: 20 mg via INTRAVENOUS
  Filled 2021-05-06: qty 50

## 2021-05-06 MED ORDER — BUTALBITAL-APAP-CAFFEINE 50-325-40 MG PO TABS: 2.0000 | ORAL_TABLET | Freq: Once | ORAL | Status: DC

## 2021-05-06 MED ORDER — BUTALBITAL-APAP-CAFFEINE 50-325-40 MG PO TABS
2.0000 | ORAL_TABLET | Freq: Once | ORAL | Status: AC
  Administered 2021-05-06: 2 via ORAL
  Filled 2021-05-06: qty 2

## 2021-05-06 MED ORDER — OXYTOCIN-SODIUM CHLORIDE 30-0.9 UT/500ML-% IV SOLN
1.0000 m[IU]/min | INTRAVENOUS | Status: DC
Start: 2021-05-06 — End: 2021-05-07
  Administered 2021-05-06: 2 m[IU]/min via INTRAVENOUS

## 2021-05-06 MED ORDER — LACTATED RINGERS IV SOLN
500.0000 mL | Freq: Once | INTRAVENOUS | Status: AC
  Administered 2021-05-06: 500 mL via INTRAVENOUS

## 2021-05-06 MED ORDER — TERBUTALINE SULFATE 1 MG/ML IJ SOLN: 0.2500 mg | Freq: Once | INTRAMUSCULAR | Status: DC | PRN

## 2021-05-06 MED ORDER — FENTANYL-BUPIVACAINE-NACL 0.5-0.125-0.9 MG/250ML-% EP SOLN
EPIDURAL | Status: AC
  Filled 2021-05-06: qty 250

## 2021-05-06 NOTE — Progress Notes (Signed)
Labor Progress Note  Patient currently undergoing 2 stage IOL at [redacted]w[redacted]d for preeclampsia with severe range BP.  She has require 3x IV labetalol and is on IV magnesium. Since starting mag she reports headache with minimal improvement with tylenol. Repeat labs this AM stable.  She reports spaced out contractions. She reports FM.  Cervix most recently checked as 2.5 / 50 / -2.  Plan to switch to pitocin infusion, AROM when able.  Plan discussed with patient, all questions answered.  Nilda Simmer MD

## 2021-05-06 NOTE — Progress Notes (Signed)
Labor Progress Note  At bedside to check on patient.  Cervix unchanged, MVU improving with pit titration, currently 22 mU/min.  FHT with improved variability and fewer decels. Accel noted at 22:21.  Continue IOL. All questions answered.

## 2021-05-06 NOTE — Progress Notes (Addendum)
Labor Progress Note  Patient now with 18 mU/min and contractions q 3-4 min but frequently irregular.  Cervix with modest change to 4 / 80 / -2. IUPC placed. Bloody show present.  Periods of decreased variability noted, as well as subtle late decels that were responsive to position changes. Variability is also confounded by mag gtt.  Discussed ongoing plan with patient including pitocin titration, however there are now signs of decreased fetal tolerance.  With severe-range BP and moderate bloody show there is also possibility of partial abruption.  We discussed Cesarean delivery in detail including risks/benefits/indications which include but are not limited to bleeding, infection, damage to nearby organs, need for additional procedure.  All questions were answered.  Reflexes intact  If FHT is no longer responsive to position changes, will recommend PLTCS for fetal intolerance. Plan to recheck cervix at 2 hr mark.  Nilda Simmer MD

## 2021-05-06 NOTE — Plan of Care (Signed)

## 2021-05-06 NOTE — Anesthesia Procedure Notes (Signed)
Epidural Patient location during procedure: OB Start time: 05/06/2021 1:02 PM End time: 05/06/2021 1:24 PM  Staffing Anesthesiologist: Lewie Loron, MD Performed: anesthesiologist   Preanesthetic Checklist Completed: patient identified, IV checked, risks and benefits discussed, monitors and equipment checked, pre-op evaluation and timeout performed  Epidural Patient position: sitting Prep: DuraPrep and site prepped and draped Patient monitoring: heart rate, continuous pulse ox and blood pressure Approach: midline Location: L2-L3 Injection technique: LOR air and LOR saline  Needle:  Needle type: Tuohy  Needle gauge: 17 G Needle length: 9 cm Needle insertion depth: 7 cm Catheter type: closed end flexible Catheter size: 19 Gauge Catheter at skin depth: 12 cm Test dose: negative  Assessment Sensory level: T8 Events: blood not aspirated, injection not painful, no injection resistance, no paresthesia and negative IV test  Additional Notes Reason for block:procedure for pain

## 2021-05-06 NOTE — Anesthesia Preprocedure Evaluation (Signed)
Anesthesia Evaluation  Patient identified by MRN, date of birth, ID band Patient awake    Reviewed: Allergy & Precautions, Patient's Chart, lab work & pertinent test results  Airway Mallampati: II  TM Distance: >3 FB Neck ROM: Full    Dental no notable dental hx.    Pulmonary neg pulmonary ROS,    Pulmonary exam normal breath sounds clear to auscultation       Cardiovascular hypertension, Normal cardiovascular exam Rhythm:Regular Rate:Normal     Neuro/Psych negative neurological ROS     GI/Hepatic negative GI ROS, Neg liver ROS,   Endo/Other  Morbid obesity  Renal/GU negative Renal ROS     Musculoskeletal negative musculoskeletal ROS (+)   Abdominal (+) + obese,   Peds  Hematology negative hematology ROS (+)   Anesthesia Other Findings   Reproductive/Obstetrics (+) Pregnancy                             Anesthesia Physical Anesthesia Plan  ASA: 3  Anesthesia Plan: Epidural   Post-op Pain Management:    Induction:   PONV Risk Score and Plan:   Airway Management Planned:   Additional Equipment:   Intra-op Plan:   Post-operative Plan:   Informed Consent: I have reviewed the patients History and Physical, chart, labs and discussed the procedure including the risks, benefits and alternatives for the proposed anesthesia with the patient or authorized representative who has indicated his/her understanding and acceptance.       Plan Discussed with:   Anesthesia Plan Comments:         Anesthesia Quick Evaluation

## 2021-05-06 NOTE — Progress Notes (Signed)
Labor Progress Note  Patient doing well, currently on 10 mU pitocin.  Headache is significantly improved. BP responsive to IV labetalol.  Cervix with change to 3/80/-1, anterior (was previously posterior).  Head well applied. AROM performed in typical fashion. Bloody show present.  FHT cat 1.  Continue IOL. All questions answered.   Nilda Simmer MD

## 2021-05-07 ENCOUNTER — Inpatient Hospital Stay (HOSPITAL_COMMUNITY): Payer: BC Managed Care – PPO

## 2021-05-07 ENCOUNTER — Encounter (HOSPITAL_COMMUNITY)
Admission: AD | Disposition: A | Discharge status: Home / Self Care | Payer: Self-pay | Source: Home / Self Care | Attending: Obstetrics and Gynecology

## 2021-05-07 ENCOUNTER — Encounter (HOSPITAL_COMMUNITY): Payer: Self-pay | Admitting: Obstetrics and Gynecology

## 2021-05-07 ENCOUNTER — Inpatient Hospital Stay (HOSPITAL_COMMUNITY)
Admission: AD | Admit: 2021-05-07 | Payer: BC Managed Care – PPO | Source: Home / Self Care | Admitting: Obstetrics & Gynecology

## 2021-05-07 LAB — CBC
HCT: 33.7 % — ABNORMAL LOW (ref 36.0–46.0)
HCT: 34.6 % — ABNORMAL LOW (ref 36.0–46.0)
Hemoglobin: 11.6 g/dL — ABNORMAL LOW (ref 12.0–15.0)
Hemoglobin: 11.6 g/dL — ABNORMAL LOW (ref 12.0–15.0)
MCH: 29.3 pg (ref 26.0–34.0)
MCH: 28.9 pg (ref 26.0–34.0)
MCHC: 34.4 g/dL (ref 30.0–36.0)
MCHC: 33.5 g/dL (ref 30.0–36.0)
MCV: 85.1 fL (ref 80.0–100.0)
MCV: 86.3 fL (ref 80.0–100.0)
Platelets: 243 10*3/uL (ref 150–400)
Platelets: 273 10*3/uL (ref 150–400)
RBC: 3.96 MIL/uL (ref 3.87–5.11)
RBC: 4.01 MIL/uL (ref 3.87–5.11)
RDW: 14.4 % (ref 11.5–15.5)
RDW: 14.5 % (ref 11.5–15.5)
WBC: 15.5 10*3/uL — ABNORMAL HIGH (ref 4.0–10.5)
WBC: 17.8 10*3/uL — ABNORMAL HIGH (ref 4.0–10.5)
nRBC: 0 % (ref 0.0–0.2)
nRBC: 0 % (ref 0.0–0.2)

## 2021-05-07 LAB — COMPREHENSIVE METABOLIC PANEL
ALT: 12 U/L (ref 0–44)
AST: 23 U/L (ref 15–41)
Albumin: 2.2 g/dL — ABNORMAL LOW (ref 3.5–5.0)
Alkaline Phosphatase: 116 U/L (ref 38–126)
Anion gap: 8 (ref 5–15)
BUN: 9 mg/dL (ref 6–20)
CO2: 21 mmol/L — ABNORMAL LOW (ref 22–32)
Calcium: 6.3 mg/dL — CL (ref 8.9–10.3)
Chloride: 96 mmol/L — ABNORMAL LOW (ref 98–111)
Creatinine, Ser: 1.03 mg/dL — ABNORMAL HIGH (ref 0.44–1.00)
GFR, Estimated: >60 mL/min (ref >60)
Glucose, Bld: 130 mg/dL — ABNORMAL HIGH (ref 70–99)
Potassium: 4.6 mmol/L (ref 3.5–5.1)
Sodium: 125 mmol/L — ABNORMAL LOW (ref 135–145)
Total Bilirubin: 0.6 mg/dL (ref 0.3–1.2)
Total Protein: 5.7 g/dL — ABNORMAL LOW (ref 6.5–8.1)

## 2021-05-07 LAB — MAGNESIUM: Magnesium: 6.5 mg/dL (ref 1.7–2.4)

## 2021-05-07 SURGERY — Surgical Case
Anesthesia: Epidural

## 2021-05-07 MED ORDER — MEPERIDINE HCL 25 MG/ML IJ SOLN: 6.2500 mg | INTRAMUSCULAR | Status: DC | PRN

## 2021-05-07 MED ORDER — SODIUM CHLORIDE 0.9% FLUSH: 3.0000 mL | INTRAVENOUS | Status: DC | PRN

## 2021-05-07 MED ORDER — LABETALOL HCL 5 MG/ML IV SOLN: 20.0000 mg | INTRAVENOUS | Status: DC | PRN

## 2021-05-07 MED ORDER — FUROSEMIDE 10 MG/ML IJ SOLN
20.0000 mg | Freq: Once | INTRAMUSCULAR | Status: AC
  Administered 2021-05-07: 20 mg via INTRAVENOUS
  Filled 2021-05-07: qty 2

## 2021-05-07 MED ORDER — LABETALOL HCL 5 MG/ML IV SOLN: 40.0000 mg | INTRAVENOUS | Status: DC | PRN

## 2021-05-07 MED ORDER — LABETALOL HCL 5 MG/ML IV SOLN: 80.0000 mg | INTRAVENOUS | Status: DC | PRN

## 2021-05-07 MED ORDER — NALOXONE HCL 0.4 MG/ML IJ SOLN: 0.4000 mg | INTRAMUSCULAR | Status: DC | PRN

## 2021-05-07 MED ORDER — STERILE WATER FOR IRRIGATION IR SOLN
Status: DC | PRN
  Administered 2021-05-07: 1000 mL

## 2021-05-07 MED ORDER — SCOPOLAMINE 1 MG/3DAYS TD PT72: 1.0000 | MEDICATED_PATCH | Freq: Once | TRANSDERMAL | Status: DC

## 2021-05-07 MED ORDER — ONDANSETRON HCL 4 MG/2ML IJ SOLN
INTRAMUSCULAR | Status: AC
  Filled 2021-05-07: qty 2

## 2021-05-07 MED ORDER — CEFAZOLIN SODIUM-DEXTROSE 2-3 GM-%(50ML) IV SOLR
INTRAVENOUS | Status: DC | PRN
  Administered 2021-05-07: 2 g via INTRAVENOUS

## 2021-05-07 MED ORDER — OXYCODONE HCL 5 MG PO TABS
5.0000 mg | ORAL_TABLET | ORAL | Status: DC | PRN
  Administered 2021-05-08: 10 mg via ORAL
  Administered 2021-05-09: 5 mg via ORAL
  Administered 2021-05-09: 10 mg via ORAL
  Filled 2021-05-07 (×2): qty 2
  Filled 2021-05-07: qty 1

## 2021-05-07 MED ORDER — KETOROLAC TROMETHAMINE 30 MG/ML IJ SOLN: 30.0000 mg | Freq: Four times a day (QID) | INTRAMUSCULAR | Status: DC | PRN

## 2021-05-07 MED ORDER — OXYTOCIN-SODIUM CHLORIDE 30-0.9 UT/500ML-% IV SOLN
INTRAVENOUS | Status: AC
  Filled 2021-05-07: qty 500

## 2021-05-07 MED ORDER — COCONUT OIL OIL
1.0000 "application " | TOPICAL_OIL | Status: DC | PRN
  Administered 2021-05-07: 1 via TOPICAL

## 2021-05-07 MED ORDER — HYDROMORPHONE HCL 1 MG/ML IJ SOLN: 0.2500 mg | INTRAMUSCULAR | Status: DC | PRN

## 2021-05-07 MED ORDER — ACETAMINOPHEN 325 MG PO TABS
650.0000 mg | ORAL_TABLET | ORAL | Status: DC | PRN
  Administered 2021-05-08 – 2021-05-09 (×3): 650 mg via ORAL
  Filled 2021-05-07 (×3): qty 2

## 2021-05-07 MED ORDER — TETANUS-DIPHTH-ACELL PERTUSSIS 5-2.5-18.5 LF-MCG/0.5 IM SUSY: 0.5000 mL | PREFILLED_SYRINGE | Freq: Once | INTRAMUSCULAR | Status: DC

## 2021-05-07 MED ORDER — SODIUM CHLORIDE 0.9 % IV SOLN: INTRAVENOUS | Status: DC | PRN

## 2021-05-07 MED ORDER — DIPHENHYDRAMINE HCL 50 MG/ML IJ SOLN: 12.5000 mg | INTRAMUSCULAR | Status: DC | PRN

## 2021-05-07 MED ORDER — ACETAMINOPHEN 500 MG PO TABS
1000.0000 mg | ORAL_TABLET | Freq: Four times a day (QID) | ORAL | Status: AC
  Administered 2021-05-07 (×3): 1000 mg via ORAL
  Filled 2021-05-07 (×3): qty 2

## 2021-05-07 MED ORDER — DEXAMETHASONE SODIUM PHOSPHATE 10 MG/ML IJ SOLN
INTRAMUSCULAR | Status: AC
  Filled 2021-05-07: qty 1

## 2021-05-07 MED ORDER — OXYCODONE HCL 5 MG PO TABS
5.0000 mg | ORAL_TABLET | Freq: Once | ORAL | Status: DC | PRN
Start: 2021-05-07 — End: 2021-05-07

## 2021-05-07 MED ORDER — HYDRALAZINE HCL 20 MG/ML IJ SOLN: 10.0000 mg | INTRAMUSCULAR | Status: DC | PRN

## 2021-05-07 MED ORDER — PROMETHAZINE HCL 25 MG/ML IJ SOLN: 6.2500 mg | INTRAMUSCULAR | Status: DC | PRN

## 2021-05-07 MED ORDER — ONDANSETRON HCL 4 MG/2ML IJ SOLN
INTRAMUSCULAR | Status: DC | PRN
  Administered 2021-05-07: 4 mg via INTRAVENOUS

## 2021-05-07 MED ORDER — DIPHENHYDRAMINE HCL 25 MG PO CAPS: 25.0000 mg | ORAL_CAPSULE | ORAL | Status: DC | PRN

## 2021-05-07 MED ORDER — NALBUPHINE HCL 10 MG/ML IJ SOLN: 5.0000 mg | Freq: Once | INTRAMUSCULAR | Status: DC | PRN

## 2021-05-07 MED ORDER — DIPHENHYDRAMINE HCL 25 MG PO CAPS: 25.0000 mg | ORAL_CAPSULE | Freq: Four times a day (QID) | ORAL | Status: DC | PRN

## 2021-05-07 MED ORDER — NALBUPHINE HCL 10 MG/ML IJ SOLN: 5.0000 mg | INTRAMUSCULAR | Status: DC | PRN

## 2021-05-07 MED ORDER — OXYTOCIN-SODIUM CHLORIDE 30-0.9 UT/500ML-% IV SOLN
2.5000 [IU]/h | INTRAVENOUS | Status: DC
  Administered 2021-05-07 (×2): 2.5 [IU]/h via INTRAVENOUS
  Filled 2021-05-07: qty 500

## 2021-05-07 MED ORDER — DEXAMETHASONE SODIUM PHOSPHATE 10 MG/ML IJ SOLN
INTRAMUSCULAR | Status: DC | PRN
  Administered 2021-05-07: 10 mg via INTRAVENOUS

## 2021-05-07 MED ORDER — ONDANSETRON HCL 4 MG/2ML IJ SOLN: 4.0000 mg | Freq: Three times a day (TID) | INTRAMUSCULAR | Status: DC | PRN

## 2021-05-07 MED ORDER — ZOLPIDEM TARTRATE 5 MG PO TABS: 5.0000 mg | ORAL_TABLET | Freq: Every evening | ORAL | Status: DC | PRN

## 2021-05-07 MED ORDER — OXYTOCIN-SODIUM CHLORIDE 30-0.9 UT/500ML-% IV SOLN
INTRAVENOUS | Status: DC | PRN
  Administered 2021-05-07: 30 [IU] via INTRAVENOUS

## 2021-05-07 MED ORDER — MAGNESIUM SULFATE 40 GM/1000ML IV SOLN
2.0000 g/h | INTRAVENOUS | Status: AC
  Administered 2021-05-07 – 2021-05-08 (×3): 2 g/h via INTRAVENOUS
  Filled 2021-05-07 (×2): qty 1000

## 2021-05-07 MED ORDER — SODIUM CHLORIDE 0.9 % IV SOLN
INTRAVENOUS | Status: DC | PRN
  Administered 2021-05-07: 500 mg via INTRAVENOUS

## 2021-05-07 MED ORDER — LACTATED RINGERS IV SOLN: INTRAVENOUS | Status: DC | PRN

## 2021-05-07 MED ORDER — MENTHOL 3 MG MT LOZG: 1.0000 | LOZENGE | OROMUCOSAL | Status: DC | PRN

## 2021-05-07 MED ORDER — MORPHINE SULFATE (PF) 0.5 MG/ML IJ SOLN
INTRAMUSCULAR | Status: DC | PRN
  Administered 2021-05-07: 3 mg via EPIDURAL

## 2021-05-07 MED ORDER — DIBUCAINE (PERIANAL) 1 % EX OINT: 1.0000 "application " | TOPICAL_OINTMENT | CUTANEOUS | Status: DC | PRN

## 2021-05-07 MED ORDER — MORPHINE SULFATE (PF) 0.5 MG/ML IJ SOLN
INTRAMUSCULAR | Status: AC
  Filled 2021-05-07: qty 10

## 2021-05-07 MED ORDER — NALOXONE HCL 4 MG/10ML IJ SOLN
1.0000 ug/kg/h | INTRAVENOUS | Status: DC | PRN
  Filled 2021-05-07: qty 5

## 2021-05-07 MED ORDER — KETOROLAC TROMETHAMINE 30 MG/ML IJ SOLN
30.0000 mg | Freq: Once | INTRAMUSCULAR | Status: AC | PRN
  Administered 2021-05-07: 30 mg via INTRAVENOUS

## 2021-05-07 MED ORDER — KETOROLAC TROMETHAMINE 30 MG/ML IJ SOLN
INTRAMUSCULAR | Status: AC
  Filled 2021-05-07: qty 1

## 2021-05-07 MED ORDER — SODIUM CHLORIDE 0.9 % IR SOLN
Status: DC | PRN
Start: 2021-05-07 — End: 2021-05-07
  Administered 2021-05-07: 1

## 2021-05-07 MED ORDER — PRENATAL MULTIVITAMIN CH
1.0000 | ORAL_TABLET | Freq: Every day | ORAL | Status: DC
  Administered 2021-05-07 – 2021-05-09 (×3): 1 via ORAL
  Filled 2021-05-07 (×3): qty 1

## 2021-05-07 MED ORDER — OXYCODONE HCL 5 MG/5ML PO SOLN: 5.0000 mg | Freq: Once | ORAL | Status: DC | PRN

## 2021-05-07 MED ORDER — LACTATED RINGERS IV SOLN: INTRAVENOUS | Status: DC

## 2021-05-07 MED ORDER — IBUPROFEN 600 MG PO TABS
600.0000 mg | ORAL_TABLET | Freq: Four times a day (QID) | ORAL | Status: DC
  Administered 2021-05-07 – 2021-05-09 (×9): 600 mg via ORAL
  Filled 2021-05-07 (×9): qty 1

## 2021-05-07 MED ORDER — LIDOCAINE-EPINEPHRINE (PF) 2 %-1:200000 IJ SOLN
INTRAMUSCULAR | Status: DC | PRN
  Administered 2021-05-07: 2 mL via EPIDURAL
  Administered 2021-05-07: 10 mL via EPIDURAL

## 2021-05-07 MED ORDER — WITCH HAZEL-GLYCERIN EX PADS: 1.0000 "application " | MEDICATED_PAD | CUTANEOUS | Status: DC | PRN

## 2021-05-07 MED ORDER — SIMETHICONE 80 MG PO CHEW
80.0000 mg | CHEWABLE_TABLET | Freq: Three times a day (TID) | ORAL | Status: DC
  Administered 2021-05-07 – 2021-05-09 (×8): 80 mg via ORAL
  Filled 2021-05-07 (×8): qty 1

## 2021-05-07 MED ORDER — SENNOSIDES-DOCUSATE SODIUM 8.6-50 MG PO TABS
2.0000 | ORAL_TABLET | Freq: Every day | ORAL | Status: DC
  Administered 2021-05-08 – 2021-05-09 (×2): 2 via ORAL
  Filled 2021-05-07 (×2): qty 2

## 2021-05-07 MED ORDER — SIMETHICONE 80 MG PO CHEW: 80.0000 mg | CHEWABLE_TABLET | ORAL | Status: DC | PRN

## 2021-05-07 SURGICAL SUPPLY — 36 items
BENZOIN TINCTURE PRP APPL 2/3 (GAUZE/BANDAGES/DRESSINGS) ×2 IMPLANT
CHLORAPREP W/TINT 26ML (MISCELLANEOUS) ×2 IMPLANT
CLAMP CORD UMBIL (MISCELLANEOUS) IMPLANT
CLOTH BEACON ORANGE TIMEOUT ST (SAFETY) ×2 IMPLANT
DRSG OPSITE POSTOP 4X10 (GAUZE/BANDAGES/DRESSINGS) ×2 IMPLANT
ELECT REM PT RETURN 9FT ADLT (ELECTROSURGICAL) ×2
ELECTRODE REM PT RTRN 9FT ADLT (ELECTROSURGICAL) ×1 IMPLANT
EXTRACTOR VACUUM M CUP 4 TUBE (SUCTIONS) IMPLANT
GLOVE BIOGEL PI IND STRL 7.0 (GLOVE) ×2 IMPLANT
GLOVE BIOGEL PI IND STRL 7.5 (GLOVE) ×1 IMPLANT
GLOVE BIOGEL PI INDICATOR 7.0 (GLOVE) ×2
GLOVE BIOGEL PI INDICATOR 7.5 (GLOVE) ×1
GLOVE ECLIPSE 7.0 STRL STRAW (GLOVE) ×2 IMPLANT
GOWN STRL REUS W/TWL LRG LVL3 (GOWN DISPOSABLE) ×4 IMPLANT
HOVERMATT SINGLE USE (MISCELLANEOUS) ×2 IMPLANT
KIT ABG SYR 3ML LUER SLIP (SYRINGE) IMPLANT
MAT PREVALON FULL STRYKER (MISCELLANEOUS) ×2 IMPLANT
NEEDLE HYPO 25X5/8 SAFETYGLIDE (NEEDLE) IMPLANT
NS IRRIG 1000ML POUR BTL (IV SOLUTION) ×2 IMPLANT
PACK C SECTION WH (CUSTOM PROCEDURE TRAY) ×2 IMPLANT
PAD OB MATERNITY 4.3X12.25 (PERSONAL CARE ITEMS) ×2 IMPLANT
PENCIL SMOKE EVAC W/HOLSTER (ELECTROSURGICAL) ×2 IMPLANT
RETRACTOR WND ALEXIS 25 LRG (MISCELLANEOUS) ×1 IMPLANT
RTRCTR WOUND ALEXIS 25CM LRG (MISCELLANEOUS) ×2
STRIP CLOSURE SKIN 1/2X4 (GAUZE/BANDAGES/DRESSINGS) ×2 IMPLANT
SUT PDS AB 0 CTX 60 (SUTURE) IMPLANT
SUT PLAIN 0 NONE (SUTURE) IMPLANT
SUT PLAIN 2 0 (SUTURE) ×1
SUT PLAIN ABS 2-0 CT1 27XMFL (SUTURE) ×1 IMPLANT
SUT VIC AB 0 CT1 36 (SUTURE) ×2 IMPLANT
SUT VIC AB 0 CTX 36 (SUTURE) ×2
SUT VIC AB 0 CTX36XBRD ANBCTRL (SUTURE) ×2 IMPLANT
SUT VIC AB 4-0 KS 27 (SUTURE) ×2 IMPLANT
TOWEL OR 17X24 6PK STRL BLUE (TOWEL DISPOSABLE) ×2 IMPLANT
TRAY FOLEY W/BAG SLVR 14FR LF (SET/KITS/TRAYS/PACK) ×2 IMPLANT
WATER STERILE IRR 1000ML POUR (IV SOLUTION) ×2 IMPLANT

## 2021-05-07 NOTE — Plan of Care (Signed)
  Problem: Education: Goal: Knowledge of condition will improve Outcome: Progressing   Problem: Activity: Goal: Ability to tolerate increased activity will improve Outcome: Progressing

## 2021-05-07 NOTE — Lactation Note (Signed)
This note was copied from a baby's chart. Lactation Consultation Note  Patient Name: Regina Larsen [id number] Date: 05/07/2021 Reason for consult: Initial assessment Age:35 hours  LC paged to mothers room. Mother reports that she is unable to remove fld from her RT breast . Mother has semi-flat nipples at present.  Nipple firms slightly with a dimple in the center. Mother reports that with the DEBP the nipple is erect.  Assist mother with reverse pressure to firm nipple. Reviewed hand expression again. Mother was sleepy this am when teaching took place and she had lots of questions.  Advised mother to attempt to latch infant with the bare breast and then use the nipple shield if unable to latch.  Reviewed pre pumping and the use of the Nipple shield. Reviewed care and application of the #24 nipple shield.  Mother reports that when infant last fed and she saw colostrum in the shield. Mother denies discomforts with feedings.  Mother to page RN or [facility] when infant is ready to breast feed again to check the latch.   Maternal Data    Feeding Mother's Current Feeding Choice: Breast Milk  LATCH Score Latch: Too sleepy or reluctant, no latch achieved, no sucking elicited.                  Lactation Tools Discussed/Used    Interventions Interventions: Hand express  Discharge    Consult Status Consult Status: Follow-up Date: 05/07/21 Follow-up type: In-patient    Stevan Born [facility] 05/07/2021, 3:21 PM

## 2021-05-07 NOTE — Transfer of Care (Signed)
Immediate Anesthesia Transfer of Care Note  Patient: Regina Larsen  Procedure(s) Performed: CESAREAN SECTION  Patient Location: PACU  Anesthesia Type:Epidural  Level of Consciousness: awake, alert  and oriented  Airway & Oxygen Therapy: Patient Spontanous Breathing  Post-op Assessment: Report given to RN  Post vital signs: Reviewed and stable  Last Vitals:  Vitals Value Taken Time  BP 135/80 05/07/21 0530  Temp    Pulse 87 05/07/21 0533  Resp 23 05/07/21 0533  SpO2 98 % 05/07/21 0533  Vitals shown include unvalidated device data.  Last Pain:  Vitals:   05/07/21 0001  TempSrc:   PainSc: 2          Complications: No notable events documented.

## 2021-05-07 NOTE — Lactation Note (Signed)
This note was copied from a baby's chart. Lactation Consultation Note  Patient Name: Regina Larsen [id number] Date: 05/07/2021 Reason for consult: Initial assessment Age:35 hours  Maternal Data Has patient been taught Hand Expression?: Yes Does the patient have breastfeeding experience prior to this delivery?: No  Feeding Mother's Current Feeding Choice: Breast Milk  LATCH Score Latch: Grasps breast easily, tongue down, lips flanged, rhythmical sucking.  Audible Swallowing: Spontaneous and intermittent  Type of Nipple: Everted at rest and after stimulation (mothers nipple is short and semi-flat)  Comfort (Breast/Nipple): Soft / non-tender  Hold (Positioning): Full assist, staff holds infant at breast  LATCH Score: 8   Lactation Tools Discussed/Used Tools: Nipple Shields Nipple shield size: 24 Flange Size: 24 Breast pump type: Double-Electric Breast Pump Pump Education: Setup, frequency, and cleaning;Milk Storage Reason for Pumping: use of the NS, lactation induction. Pumping frequency: after each feeding at least every 3 hours Pumped volume: 0.5 mL  Interventions Interventions: Breast feeding basics reviewed;Assisted with latch;Skin to skin;Hand express;Pre-pump if needed;Reverse pressure;Breast compression;Adjust position;Support pillows;Shells;Hand pump;DEBP  Discharge Pump: Personal;Manual  Consult Status Consult Status: Follow-up Date: 05/07/21 Follow-up type: In-patient    Stevan Born [facility] 05/07/2021, 11:36 AM

## 2021-05-07 NOTE — Anesthesia Postprocedure Evaluation (Signed)
Anesthesia Post Note  Patient: Meilin Brosh  Procedure(s) Performed: CESAREAN SECTION     Patient location during evaluation: PACU Anesthesia Type: Epidural Level of consciousness: awake and alert and oriented Pain management: pain level controlled Vital Signs Assessment: post-procedure vital signs reviewed and stable Respiratory status: spontaneous breathing, nonlabored ventilation and respiratory function stable Cardiovascular status: blood pressure returned to baseline and stable Postop Assessment: no headache, no backache, patient able to bend at knees, epidural receding and no apparent nausea or vomiting Anesthetic complications: no   No notable events documented.  Last Vitals:  Vitals:   05/07/21 0545 05/07/21 0600  BP: 126/83 (!) 135/91  Pulse: 85 92  Resp: (!) 21 (!) 22  Temp:  36.7 C  SpO2: 99% 93%    Last Pain:  Vitals:   05/07/21 0600  TempSrc: Oral  PainSc: 2    Pain Goal:    LLE Motor Response: Purposeful movement (05/07/21 0545)   RLE Motor Response: Purposeful movement (05/07/21 0545)   L Sensory Level: L5-Outer lower leg, top of foot, great toe (05/07/21 0545) R Sensory Level: L5-Outer lower leg, top of foot, great toe (05/07/21 0545) Epidural/Spinal Function Cutaneous sensation: Able to Wiggle Toes (05/07/21 0545), Patient able to flex knees: Yes (05/07/21 0545), Patient able to lift hips off bed: No (05/07/21 0545), Back pain beyond tenderness at insertion site: No (05/07/21 0545), Progressively worsening motor and/or sensory loss: No (05/07/21 0545), Bowel and/or bladder incontinence post epidural: No (05/07/21 0545)  Lannie Fields

## 2021-05-07 NOTE — Progress Notes (Signed)
Subjective: Postpartum Day 0: Cesarean Delivery Patient reports tolerating PO.  No HAs, or scotomata  Objective: Vital signs in last 24 hours: Temp:  [97.4 F (36.3 C)-98.7 F (37.1 C)] 97.6 F (36.4 C) (07/12 1538) Pulse Rate:  [70-110] 79 (07/12 1538) Resp:  [15-24] 18 (07/12 1538) BP: (107-167)/(60-109) 131/78 (07/12 1538) SpO2:  [91 %-100 %] 98 % (07/12 1538)  Physical Exam:  General: alert, cooperative, appears stated age, and no distress Lochia: appropriate Uterine Fundus: firm Incision: healing well DVT Evaluation: No evidence of DVT seen on physical exam.  Recent Labs    05/07/21 0634 05/07/21 1259  HGB 11.6* 11.6*  HCT 33.7* 34.6*    Assessment/Plan: Status post Cesarean section. Doing well postoperatively.  Continue current care Diuresis is borderline.  Will give lasix 20mg  Mag until 4am Recheck labs in AM.  05/07/2021, 5:42 PM

## 2021-05-07 NOTE — Lactation Note (Addendum)
This note was copied from a baby's chart. Lactation Consultation Note  Patient Name: Regina Larsen [id number] Date: 05/07/2021 Reason for consult: Initial assessment Age:35 Hours  Mother is a P34,  Mother was fit with a #24 NS with piror feeding. Mother reports that her nipples are not usually flat.  Mother was given [facility] brochure and basic teaching done.  Mother taught to firm nipple with a  hand pump was given to her with instructions. She wanted to try her haakaa. The flange too large on the haakaa.  Reviewed hand expression with mother. Observed drops of colostrum from both breast. Mother has areola edema.  Attempt to latch infant and infant took multiple sucks but was unable to sustain depth.  Mother taught to do reverse pressure . She was given shells and a DEBP was sat up  with instructions.  infant latched on using the NS, lips widely flanged. Father at the bedside for teaching.  Observed swallows and rhythmic suckles.infant sustained latch for 15 mins. Discussed observing milk in the shield after feeding.  Mother sleepy. Father attentive to all teaching. Staff nurse in room and reports that she will assist mother with pumping later.    Breastfeed infant with feeding cues and do frequent STS. Discussed supplementing infant with express breastmilk using a spoon after feedings.  Pump using a DEBP after each feeding for 15-20 mins.   Mother to continue to cue base feed infant and feed at least 8-12 times or more in 24 hours and advised to allow for cluster feeding infant as needed.   Mother to continue to due STS. Mother is aware of available LC services at [facility], BFSG'S, OP Dept, and phone # for questions or concerns about breastfeeding.  Mother receptive to all teaching and plan of care.    Maternal Data Has patient been taught Hand Expression?: Yes Does the patient have breastfeeding experience prior to this delivery?: No  Feeding Mother's Current Feeding Choice: Breast Milk  LATCH  Score Latch: Grasps breast easily, tongue down, lips flanged, rhythmical sucking.  Audible Swallowing: Spontaneous and intermittent  Type of Nipple: Everted at rest and after stimulation (mothers nipple is short and semi-flat)  Comfort (Breast/Nipple): Soft / non-tender  Hold (Positioning): Full assist, staff holds infant at breast  LATCH Score: 8   Lactation Tools Discussed/Used Tools: Nipple Shields Nipple shield size: 24 Flange Size: 24 Breast pump type: Double-Electric Breast Pump Pump Education: Setup, frequency, and cleaning;Milk Storage Reason for Pumping: use of the NS, lactation induction. Pumping frequency: after each feeding at least every 3 hours Pumped volume: 0.5 mL  Interventions Interventions: Breast feeding basics reviewed;Assisted with latch;Skin to skin;Hand express;Pre-pump if needed;Reverse pressure;Breast compression;Adjust position;Support pillows;Shells;Hand pump;DEBP  Discharge Pump: Personal;Manual  Consult Status Consult Status: Follow-up Date: 05/07/21 Follow-up type: In-patient    Stevan Born [facility] 05/07/2021, 9:49 AM

## 2021-05-07 NOTE — Plan of Care (Signed)
  Problem: Education: Goal: Knowledge of General Education information will improve Description: Including pain rating scale, medication(s)/side effects and non-pharmacologic comfort measures Outcome: Progressing   Problem: Health Behavior/Discharge Planning: Goal: Ability to manage health-related needs will improve Outcome: Progressing   Problem: Clinical Measurements: Goal: Ability to maintain clinical measurements within normal limits will improve Outcome: Progressing Goal: Will remain free from infection Outcome: Progressing Goal: Diagnostic test results will improve Outcome: Progressing Goal: Respiratory complications will improve Outcome: Progressing Goal: Cardiovascular complication will be avoided Outcome: Progressing   Problem: Activity: Goal: Risk for activity intolerance will decrease Outcome: Progressing   Problem: Nutrition: Goal: Adequate nutrition will be maintained Outcome: Progressing   Problem: Coping: Goal: Level of anxiety will decrease Outcome: Progressing   Problem: Elimination: Goal: Will not experience complications related to bowel motility Outcome: Progressing Goal: Will not experience complications related to urinary retention Outcome: Progressing   Problem: Pain Managment: Goal: General experience of comfort will improve Outcome: Progressing   Problem: Safety: Goal: Ability to remain free from injury will improve Outcome: Progressing   Problem: Skin Integrity: Goal: Risk for impaired skin integrity will decrease Outcome: Progressing   Problem: Education: Goal: Knowledge of Childbirth will improve Outcome: Completed/Met Goal: Ability to make informed decisions regarding treatment and plan of care will improve Outcome: Completed/Met Goal: Ability to state and carry out methods to decrease the pain will improve Outcome: Completed/Met Goal: Individualized Educational Video(s) Outcome: Completed/Met   Problem: Coping: Goal: Ability  to verbalize concerns and feelings about labor and delivery will improve Outcome: Completed/Met   Problem: Life Cycle: Goal: Ability to make normal progression through stages of labor will improve Outcome: Completed/Met Goal: Ability to effectively push during vaginal delivery will improve Outcome: Completed/Met   Problem: Role Relationship: Goal: Will demonstrate positive interactions with the child Outcome: Completed/Met   Problem: Safety: Goal: Risk of complications during labor and delivery will decrease Outcome: Completed/Met   Problem: Pain Management: Goal: Relief or control of pain from uterine contractions will improve Outcome: Completed/Met

## 2021-05-07 NOTE — Op Note (Signed)
PROCEDURE DATE: 05/07/21   PREOPERATIVE DIAGNOSIS: Preeclampsia with severe features, nonreassuring fetal status, vaginal bleeding, failed induction   POSTOPERATIVE DIAGNOSIS: The same   PROCEDURE:    Primary Low Transverse Cesarean Section   SURGEON:  Dr. Nilda Simmer   INDICATIONS: This is a 35 yo G1P0 at 84 wga that was being induced for preeclampsia with severe features (severe range BP).  She required multiple rounds of IV antihypertensives and was on magnesium infusion. She underwent 2 stage IOL and progressed slowly. Her labor course was complicated by nonreassuring fetal status.  Decision made to proceed with LTCS. The risks of cesarean section discussed with the patient included but were not limited to: bleeding which may require transfusion or reoperation; infection which may require antibiotics; injury to bowel, bladder, ureters or other surrounding organs; injury to the fetus; need for additional procedures including hysterectomy in the event of a life-threatening hemorrhage; placental abnormalities wth subsequent pregnancies, incisional problems, thromboembolic phenomenon and other postoperative/anesthesia complications. The patient agreed with the proposed plan, giving informed consent for the procedure.     FINDINGS:  Viable female infant in vertex presentation, occiput posterior, APGARs8, 9,  Weight pending, Amniotic fluid meconium stained,  Intact placenta, three vessel cord with known marginal cord insertion.  Grossly normal uterus. .   ANESTHESIA:    Epidural ESTIMATED BLOOD LOSS: 500 cc SPECIMENS: Placenta for pathology (possible abruption) COMPLICATIONS: None immediate    PROCEDURE IN DETAIL:  The patient received intravenous antibiotics (2g Ancef) and had sequential compression devices applied to her lower extremities while in the preoperative area.  She was then taken to the operating room where epidural anesthesia was dosed up to surgical level and was found to be  adequate. She was then placed in a dorsal supine position with a leftward tilt, and prepped and draped in a sterile manner.  A foley catheter was placed into her bladder and attached to constant gravity.  After an adequate timeout was performed, a Pfannenstiel skin incision was made with scalpel and carried through to the underlying layer of fascia. The fascia was incised in the midline and this incision was extended bilaterally using the Mayo scissors. Kocher clamps were applied to the superior aspect of the fascial incision and the underlying rectus muscles were dissected off bluntly. A similar process was carried out on the inferior aspect of the facial incision. The rectus muscles were separated in the midline bluntly and the peritoneum was entered bluntly.  A bladder flap was created sharply and developed bluntly. A transverse hysterotomy was made with a scalpel and extended bilaterally bluntly. The bladder blade was then removed. The infant was successfully delivered, and cord was clamped and cut and infant was handed over to awaiting neonatology team. Uterine massage was then administered and the placenta delivered intact with three-vessel cord. Cord gases were taken. The uterus was cleared of clot and debris.  The hysterotomy was closed with 0 vicryl. The fascia was closed with 0-PDS in a running fashion with good restoration of anatomy.  The subcutaneus tissue was irrigated and was reapproximated using running plain gut stitches.  The skin was closed with 4-0 Vicryl in a subcuticular fashion.  All surgical site and was hemostatic at end of procedure without any further bleeding on exam.    Pt tolerated the procedure well. All sponge/lap/needle counts were correct  X 2. Pt taken to recovery room in stable condition.   Nilda Simmer MD

## 2021-05-07 NOTE — Lactation Note (Addendum)
This note was copied from a baby's chart. Lactation Consultation Note  Patient Name: Regina Larsen [id number] Date: 05/07/2021 Reason for consult: Follow-up assessment;Mother's request;Difficult latch;Term Age:35 hours  Mom attempting to latch on L arrival. LC assisted getting infant supported on pillows in football on the left breast. LC attempted latch without NS infant not able to sustain it. LC latched with 24 NS and signs of milk transfer with breast compression. Mom told to ensure lips are flanged out top and bottom use chin tug to bring out lower lip and ensure cheeks and nose touching the breast.   Mom using 24 flange pre pumping 5-10 min before latching. LC instructed Mom to feed based on cues 8-12x in 24 hr period no more than 3 hrs without an attempt. LC removed shield and filled with colostrum.  Mom offer EBM via spoon if unable to get infant to latch. Crying late cue, Mom try to latch before that and offer EBM via spoon first then latch if needed.   All questions answered at the end of the visit.  Infant 2 stool and 1 urine so far.   Maternal Data Has patient been taught Hand Expression?: Yes  Feeding Mother's Current Feeding Choice: Breast Milk  LATCH Score Latch: Repeated attempts needed to sustain latch, nipple held in mouth throughout feeding, stimulation needed to elicit sucking reflex.  Audible Swallowing: Spontaneous and intermittent  Type of Nipple: Flat (but will evert with stimulation. infant not able to sustain the latch short shafted without use of 24 NS.)  Comfort (Breast/Nipple): Soft / non-tender  Hold (Positioning): Assistance needed to correctly position infant at breast and maintain latch.  LATCH Score: 7   Lactation Tools Discussed/Used Tools: Pump;Flanges Nipple shield size: 24 Flange Size: 24 (Mom states 24 flange comfortable fit, denies any pain or discomfort with use) Breast pump type: Double-Electric Breast Pump Pump Education: Setup,  frequency, and cleaning;Milk Storage Reason for Pumping: elongate her nipple Pumping frequency: pre pumping for 5-10 min before latching  Interventions Interventions: Breast feeding basics reviewed;Breast compression;Assisted with latch;Adjust position;Skin to skin;Support pillows;DEBP;Position options;Hand express;Expressed milk;Education;Pre-pump if needed  Discharge    Consult Status Consult Status: Follow-up Date: 05/08/21 Follow-up type: In-patient    [name]  [name] 05/07/2021, 7:02 PM

## 2021-05-07 NOTE — Progress Notes (Signed)
At bedside for prolonged deceleration.  Deceleration ~ in duration with nadir to 80s.  This was preceded by late decelerations.  Pitocin turn off (was 24 mU/min) and position changes done. FHT now recovered to 125/moderate variability/no accels/no further decels.  Cervix with slight change to 4-5 cm dilation in the past 4 hrs.  Discussed with patient, expressed concern for nonreassuring status, lack of progress, and recommended C section for fetal intolerance and failed induction.  Pt accepts.  Earlier in the night we discussed risk/benefits/indications of C section as previously documented.  Will leave pit off, proceed to OR when able.  Nilda Simmer MD

## 2021-05-08 LAB — CBC
HCT: 31.1 % — ABNORMAL LOW (ref 36.0–46.0)
Hemoglobin: 10.4 g/dL — ABNORMAL LOW (ref 12.0–15.0)
MCH: 29.3 pg (ref 26.0–34.0)
MCHC: 33.4 g/dL (ref 30.0–36.0)
MCV: 87.6 fL (ref 80.0–100.0)
Platelets: 258 10*3/uL (ref 150–400)
RBC: 3.55 MIL/uL — ABNORMAL LOW (ref 3.87–5.11)
RDW: 14.8 % (ref 11.5–15.5)
WBC: 12.2 10*3/uL — ABNORMAL HIGH (ref 4.0–10.5)
nRBC: 0 % (ref 0.0–0.2)

## 2021-05-08 LAB — COMPREHENSIVE METABOLIC PANEL
ALT: 11 U/L (ref 0–44)
AST: 16 U/L (ref 15–41)
Albumin: 2.2 g/dL — ABNORMAL LOW (ref 3.5–5.0)
Alkaline Phosphatase: 101 U/L (ref 38–126)
Anion gap: 6 (ref 5–15)
BUN: 12 mg/dL (ref 6–20)
CO2: 24 mmol/L (ref 22–32)
Calcium: 6.1 mg/dL — CL (ref 8.9–10.3)
Chloride: 101 mmol/L (ref 98–111)
Creatinine, Ser: 0.83 mg/dL (ref 0.44–1.00)
GFR, Estimated: >60 mL/min (ref >60)
Glucose, Bld: 87 mg/dL (ref 70–99)
Potassium: 3.9 mmol/L (ref 3.5–5.1)
Sodium: 131 mmol/L — ABNORMAL LOW (ref 135–145)
Total Bilirubin: 0.3 mg/dL (ref 0.3–1.2)
Total Protein: 5.7 g/dL — ABNORMAL LOW (ref 6.5–8.1)

## 2021-05-08 LAB — SURGICAL PATHOLOGY

## 2021-05-08 MED ORDER — LABETALOL HCL 100 MG PO TABS
100.0000 mg | ORAL_TABLET | Freq: Two times a day (BID) | ORAL | Status: DC
  Administered 2021-05-08 – 2021-05-09 (×2): 100 mg via ORAL
  Filled 2021-05-08 (×2): qty 1

## 2021-05-08 NOTE — Progress Notes (Signed)
POD # 2 Severe Preeclampsia  Patient has had magnesium disconnected  Doing well. No complaints.  BP (!) 145/97 (BP Location: Right Arm)   Pulse 94   Temp 98.1 F (36.7 C) (Other (Comment))   Resp 18   Ht 5\' 4"  (1.626 m)   Wt 122.9 kg   LMP 03/19/2020   SpO2 100%   Breastfeeding Unknown   BMI 46.52 kg/m  Results for orders placed or performed during the hospital encounter of 05/05/21 (from the past 24 hour(s))  CBC     Status: Abnormal   Collection Time: 05/07/21 12:59 PM  Result Value Ref Range   WBC 17.8 (H) 4.0 - 10.5 K/uL   RBC 4.01 3.87 - 5.11 MIL/uL   Hemoglobin 11.6 (L) 12.0 - 15.0 g/dL   HCT 07/08/21 (L) 85.8 - 85.0 %   MCV 86.3 80.0 - 100.0 fL   MCH 28.9 26.0 - 34.0 pg   MCHC 33.5 30.0 - 36.0 g/dL   RDW 27.7 41.2 - 87.8 %   Platelets 273 150 - 400 K/uL   nRBC 0.0 0.0 - 0.2 %  Comprehensive metabolic panel     Status: Abnormal   Collection Time: 05/07/21 12:59 PM  Result Value Ref Range   Sodium 125 (L) 135 - 145 mmol/L   Potassium 4.6 3.5 - 5.1 mmol/L   Chloride 96 (L) 98 - 111 mmol/L   CO2 21 (L) 22 - 32 mmol/L   Glucose, Bld 130 (H) 70 - 99 mg/dL   BUN 9 6 - 20 mg/dL   Creatinine, Ser 07/08/21 (H) 0.44 - 1.00 mg/dL   Calcium 6.3 (LL) 8.9 - 10.3 mg/dL   Total Protein 5.7 (L) 6.5 - 8.1 g/dL   Albumin 2.2 (L) 3.5 - 5.0 g/dL   AST 23 15 - 41 U/L   ALT 12 0 - 44 U/L   Alkaline Phosphatase 116 38 - 126 U/L   Total Bilirubin 0.6 0.3 - 1.2 mg/dL   GFR, Estimated 7.20 >94 mL/min   Anion gap 8 5 - 15  Magnesium     Status: Abnormal   Collection Time: 05/07/21 12:59 PM  Result Value Ref Range   Magnesium 6.5 (HH) 1.7 - 2.4 mg/dL  CBC     Status: Abnormal   Collection Time: 05/08/21  5:33 AM  Result Value Ref Range   WBC 12.2 (H) 4.0 - 10.5 K/uL   RBC 3.55 (L) 3.87 - 5.11 MIL/uL   Hemoglobin 10.4 (L) 12.0 - 15.0 g/dL   HCT 05/10/21 (L) 96.2 - 83.6 %   MCV 87.6 80.0 - 100.0 fL   MCH 29.3 26.0 - 34.0 pg   MCHC 33.4 30.0 - 36.0 g/dL   RDW 62.9 47.6 - 54.6 %   Platelets  258 150 - 400 K/uL   nRBC 0.0 0.0 - 0.2 %  Comprehensive metabolic panel     Status: Abnormal   Collection Time: 05/08/21  5:33 AM  Result Value Ref Range   Sodium 131 (L) 135 - 145 mmol/L   Potassium 3.9 3.5 - 5.1 mmol/L   Chloride 101 98 - 111 mmol/L   CO2 24 22 - 32 mmol/L   Glucose, Bld 87 70 - 99 mg/dL   BUN 12 6 - 20 mg/dL   Creatinine, Ser 05/10/21 0.44 - 1.00 mg/dL   Calcium 6.1 (LL) 8.9 - 10.3 mg/dL   Total Protein 5.7 (L) 6.5 - 8.1 g/dL   Albumin 2.2 (L) 3.5 - 5.0 g/dL  AST 16 15 - 41 U/L   ALT 11 0 - 44 U/L   Alkaline Phosphatase 101 38 - 126 U/L   Total Bilirubin 0.3 0.3 - 1.2 mg/dL   GFR, Estimated >64 >40 mL/min   Anion gap 6 5 - 15   Abdomen is soft and non tender Uterus firm Bandage is clean and dry   IMPRESSION: POD # 2  Severe Preeclampsia  PLAN: Routine care Discharge home tomorrow

## 2021-05-08 NOTE — Lactation Note (Signed)
This note was copied from a baby's chart. Lactation Consultation Note  Patient Name: Regina Larsen [id number] Date: 05/08/2021 Reason for consult: Difficult latch;Primapara;1st time breastfeeding;Term;Infant weight loss  Age:35 hours  LC in to visit with P1 Mom and FOB.  Baby currently at 8% weight loss and output WNL  Bilirubin elevated to 8.1 at 23 hrs.  RN recommended supplementation with donor milk as Mom is having difficult time with latching.  Mom has been pre-pumping using a hand pump and applying the 24 mm nipple shield.    Baby sleeping swaddled in crib.  LC offered to assist with positioning and latching.  Mom had pumped over 6 hrs ago for drops of colostrum which she syringe fed to baby.  Reminded Mom to have baby sucking on finger while syringe feeding colostrum (demonstration done after feeding).  Baby noted to have a recessed lower jaw.  Baby placed STS across Mom's chest.  Some cueing noted.  Baby's mouth tight and she is not opening wide enough to latch to Mom.  Mom has large breast, compressible areola and flat nipples.  Reviewed hand expression, unable to express any drops currently.   LC initiated a 24 mm nipple shield, demonstrating how to properly apply after hand pumping to evert nipple.  Nipple pulled partly into shield.  Baby rooting and latched, but not deep enough to breast.  Tugged on chin to open mouth wider and uncurl lower lip.   LC instilled donor breast milk into shield and baby sucked and swallowed while supplement was flowing. Baby still unable to sustain a deep latch.  Baby placed STS on Mom's chest asleep after feeding remainder fo 10 ml by finger. FOB assisted with this under supervision.  Talked to parents about paced bottle feeding to supplement baby.  Explained the need to supplement due to baby's difficult latch to breast.  Reassured Mom about importance of offering breast each time and keeping baby STS.    Talked about the importance of OP lactation  F/U and pediatrician has IBCLC on staff.    Plan- 1- STS as much as possible 2- Offer breast with cues, using hand pump and nipple shield to help with latch 3- Offer supplement with EBM+/donor breast milk per volume guidelines by paced bottle or curved tip syringe in nipple shield 4- Pump both breasts on initiation setting. 5- Lactation F/U after discharge recommended 6- ask for help prn  Feeding Nipple Type: Slow - flow  LATCH Score Latch: Repeated attempts needed to sustain latch, nipple held in mouth throughout feeding, stimulation needed to elicit sucking reflex.  Audible Swallowing: None (only with supplement of donor milk at the breast)  Type of Nipple: Flat  Comfort (Breast/Nipple): Soft / non-tender  Hold (Positioning): Full assist, staff holds infant at breast  LATCH Score: 4   Lactation Tools Discussed/Used Tools: Pump;Flanges;Nipple Dorris Carnes;Bottle Nipple shield size: 24 Flange Size: 24 Breast pump type: Double-Electric Breast Pump Pump Education: Setup, frequency, and cleaning;Milk Storage Pumping frequency: Q 3 encouraged Pumped volume: 0 mL (drops)  Interventions Interventions: Breast feeding basics reviewed;Assisted with latch;Skin to skin;Breast massage;Hand express;Pre-pump if needed;Adjust position;Support pillows;Position options;Hand pump;DEBP;Education  Discharge    Consult Status Consult Status: Follow-up Date: 05/09/21 Follow-up type: In-patient    Judee Clara 05/08/2021, 12:56 PM

## 2021-05-09 MED ORDER — LABETALOL HCL 100 MG PO TABS: 100.0000 mg | ORAL_TABLET | Freq: Three times a day (TID) | ORAL | 1 refills | Status: AC

## 2021-05-09 MED ORDER — ACETAMINOPHEN 325 MG PO TABS: 650.0000 mg | ORAL_TABLET | Freq: Four times a day (QID) | ORAL | 0 refills | Status: AC | PRN

## 2021-05-09 MED ORDER — OXYCODONE HCL 5 MG PO TABS: 5.0000 mg | ORAL_TABLET | Freq: Four times a day (QID) | ORAL | 0 refills | Status: AC | PRN

## 2021-05-09 MED ORDER — IBUPROFEN 600 MG PO TABS: 600.0000 mg | ORAL_TABLET | Freq: Four times a day (QID) | ORAL | 0 refills | Status: AC | PRN

## 2021-05-09 NOTE — Plan of Care (Signed)

## 2021-05-09 NOTE — Lactation Note (Addendum)
This note was copied from a baby's chart. Lactation Consultation Note  Patient Name: Regina Larsen [id number] Date: 05/09/2021 Reason for consult: Follow-up assessment;Difficult latch;1st time breastfeeding;Primapara;Term;Hyperbilirubinemia Age:35 hours IVF pregnancy, AMA, GHTN  LC in to visit with P1 Mom of term infant.  Baby is at 9% weight loss, a loss of 39 gms from yesterday.  Waiting on serum bilirubin level as TcB is elevated also.  Mom is discharged, but unsure if baby will be.  Mom has been trying to breastfeed occasionally, but mainly pumping and bottle feeding.  Mom shared that she has had some anxiety and is trying to keep it simple.  Encouraged Mom to increase her pumping frequency to support her milk supply.    Pumping parts sitting in water in bin by sink.  Reminded parents not to soak pump parts, but to wash, rinse and dry in separate bin away from sink.  CDC guidelines handout provided.  Encouraged STS and feeding baby often with feeding cues.  Mom to increase volume to 20-30 ml per feeding and increasing as tolerated.  Mom pace bottle feeding.  Mom using coconut oil on nipples and encouraged to hand express drops onto nipple for healing.  Mom plans to see IBCLC at Pediatrician Office on discharge.  Mom aware of OP Lactation support available to her.   Lactation Tools Discussed/Used Tools: Pump;Bottle Breast pump type: Double-Electric Breast Pump Pump Education: Setup, frequency, and cleaning Pumping frequency: 3 times Pumped volume: 12 mL  Interventions Interventions: Skin to skin;Breast massage;Hand express;Coconut oil;Hand pump;DEBP;Education  Discharge Discharge Education: Outpatient recommendation;Engorgement and breast care;Warning signs for feeding baby  Consult Status Consult Status: Follow-up Date: 05/10/21 Follow-up type: In-patient    Regina Larsen 05/09/2021, 11:40 AM

## 2021-05-09 NOTE — Discharge Summary (Signed)
Postpartum Discharge Summary  Date of Service updated7/14/22     Patient Name: Regina Larsen DOB: Sep 15, 1986 MRN: [id number]  Date of admission: 05/05/2021 Delivery date:05/07/2021  Delivering provider: Eyvonne Mechanic A  Date of discharge: 05/09/2021  Admitting diagnosis: Severe preeclampsia [O14.10] Intrauterine pregnancy: [redacted]w[redacted]d    Secondary diagnosis:  Active Problems:   Severe preeclampsia  Additional problems:     Discharge diagnosis: Term Pregnancy Delivered and Preeclampsia (severe)                                              Post partum procedures: Augmentation: AROM and Pitocin Complications: None  Hospital course: Induction of Labor With Cesarean Section   35y.o. yo G1P1001 at 452w4das admitted to the hospital 05/05/2021 for induction of labor. Patient had a labor course significant for severe preeclampsia. The patient went for cesarean section due to Arrest of Dilation. Delivery details are as follows: Membrane Rupture Time/Date: 1:51 PM ,05/06/2021   Delivery Method:C-Section, Low Transverse  Details of operation can be found in separate operative Note.  Patient had an uncomplicated postpartum course. She is ambulating, tolerating a regular diet, passing flatus, and urinating well.  Patient is discharged home in stable condition on 05/09/21.      Newborn Data: Birth date:05/07/2021  Birth time:4:29 AM  Gender:Female  Living status:Living  Apgars:8 ,9  Weight:3104 g                                Magnesium Sulfate received: Yes: Seizure prophylaxis BMZ received: No Rhophylac:No MMR:No T-DaP:Given prenatally Flu: No Transfusion:No  Physical exam  Vitals:   05/08/21 2018 05/08/21 2333 05/08/21 2334 05/09/21 0340  BP: (!) 144/67 (!) 178/89 (!) 144/76 138/80  Pulse: 98 (!) 102 98 90  Resp: 18  18 18   Temp: 98.6 F (37 C)  98.6 F (37 C) 98.3 F (36.8 C)  TempSrc: Oral  Oral Oral  SpO2: 100%  99% 100%  Weight:      Height:       General: alert,  cooperative, and no distress Lochia: appropriate Uterine Fundus: firm Incision: Healing well with no significant drainage DVT Evaluation: No evidence of DVT seen on physical exam. Labs: Lab Results  Component Value Date   WBC 12.2 (H) 05/08/2021   HGB 10.4 (L) 05/08/2021   HCT 31.1 (L) 05/08/2021   MCV 87.6 05/08/2021   PLT 258 05/08/2021   CMP Latest Ref Rng & Units 05/08/2021  Glucose 70 - 99 mg/dL 87  BUN 6 - 20 mg/dL 12  Creatinine 0.44 - 1.00 mg/dL 0.83  Sodium 135 - 145 mmol/L 131(L)  Potassium 3.5 - 5.1 mmol/L 3.9  Chloride 98 - 111 mmol/L 101  CO2 22 - 32 mmol/L 24  Calcium 8.9 - 10.3 mg/dL 6.1(LL)  Total Protein 6.5 - 8.1 g/dL 5.7(L)  Total Bilirubin 0.3 - 1.2 mg/dL 0.3  Alkaline Phos 38 - 126 U/L 101  AST 15 - 41 U/L 16  ALT 0 - 44 U/L 11   Edinburgh Score: No flowsheet data found.    After visit meds:  Allergies as of 05/09/2021   No Known Allergies      Medication List     STOP taking these medications    aspirin 81 MG EC tablet  methocarbamol 500 MG tablet Commonly known as: Robaxin   valACYclovir 500 MG tablet Commonly known as: VALTREX       TAKE these medications    acetaminophen 325 MG tablet Commonly known as: TYLENOL Take 2 tablets (650 mg total) by mouth every 6 (six) hours as needed for mild pain (temperature > 101.5.).   ibuprofen 600 MG tablet Commonly known as: ADVIL Take 1 tablet (600 mg total) by mouth every 6 (six) hours as needed.   labetalol 100 MG tablet Commonly known as: NORMODYNE Take 1 tablet (100 mg total) by mouth 3 (three) times daily.   oxyCODONE 5 MG immediate release tablet Commonly known as: Oxy IR/ROXICODONE Take 1 tablet (5 mg total) by mouth every 6 (six) hours as needed for moderate pain.   prenatal multivitamin Tabs tablet Take 1 tablet by mouth daily at 12 noon.         Discharge home in stable condition Infant Feeding: Breast Infant Disposition:home with mother Discharge instruction:  per After Visit Summary and Postpartum booklet. Activity: Advance as tolerated. Pelvic rest for 6 weeks.  Diet: routine diet Anticipated Birth Control: Unsure Postpartum Appointment:1 week Additional Postpartum F/U:  Future Appointments:No future appointments. Follow up Visit:      05/09/2021 Allena Katz, MD

## 2021-05-18 ENCOUNTER — Telehealth (HOSPITAL_COMMUNITY): Payer: Self-pay

## 2021-05-18 NOTE — Telephone Encounter (Signed)
No answer. Left message to return nurse call.   Marcelino Duster [facility] 05/18/2021,1358

## 2022-09-20 IMAGING — US US MFM OB DETAIL+14 WK
1 series · 13 of 28 positions shown · non-contrast
Comparison: none

[Series 1: us mfm ob detail+14 wk · 119 acquisitions, 13 frames shown]
[im 5/119]
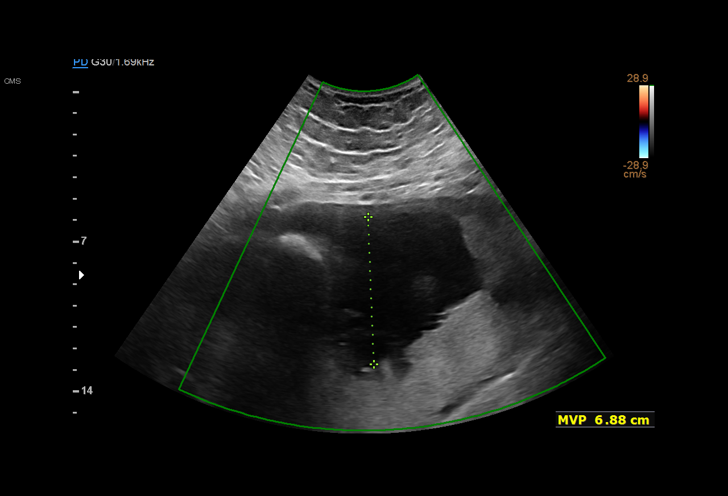
[im 14/119]
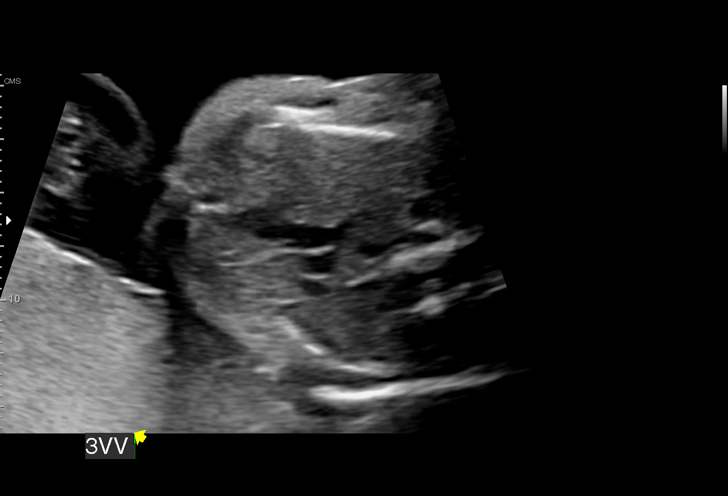
[im 22/119]
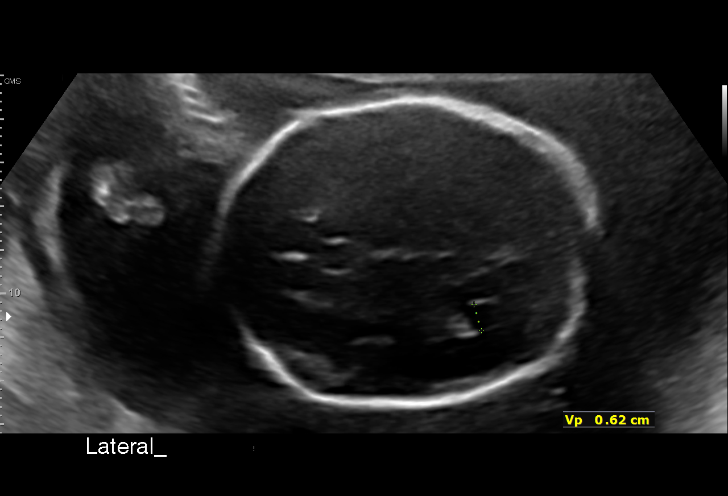
[im 31/119]
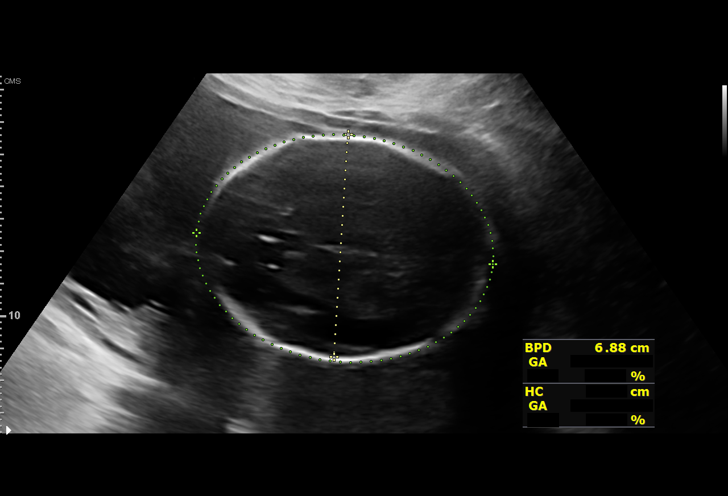
[im 40/119]
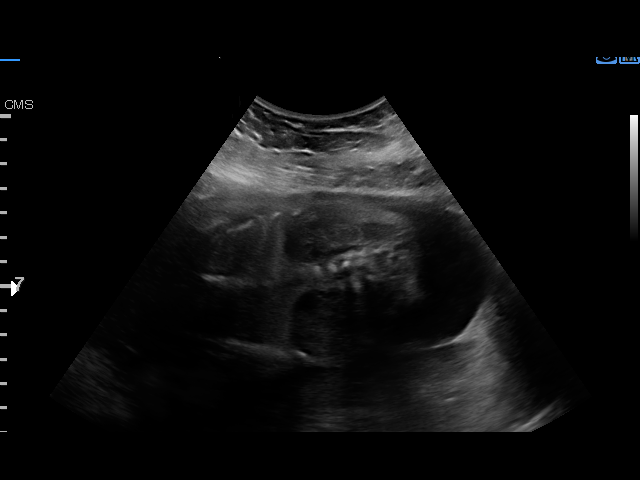
[im 49/119]
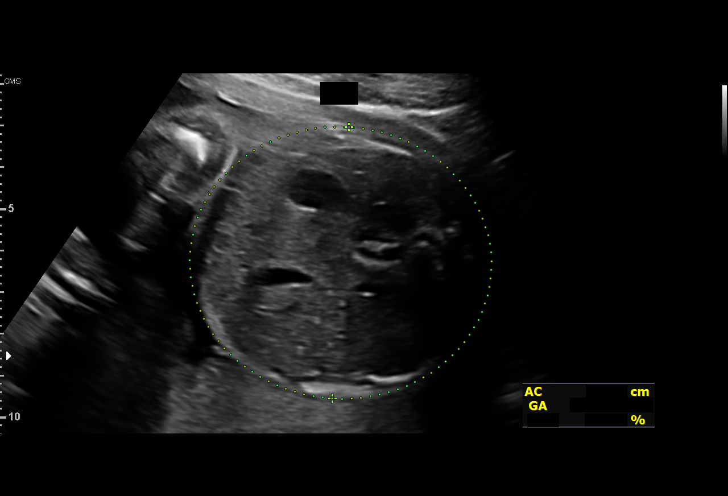
[im 62/119]
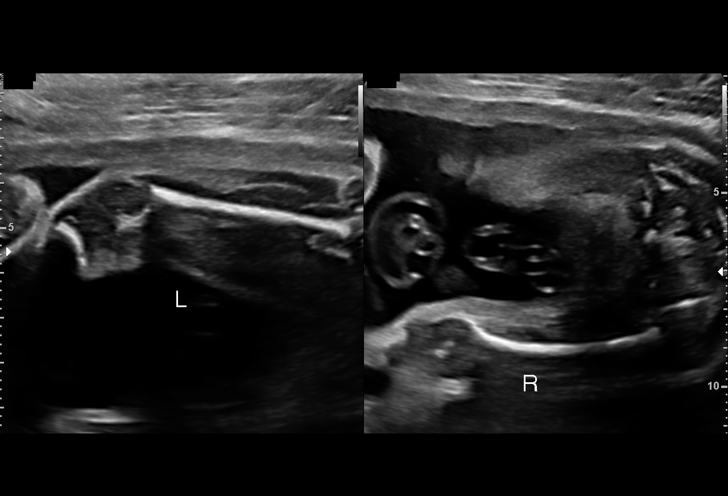
[im 70/119]
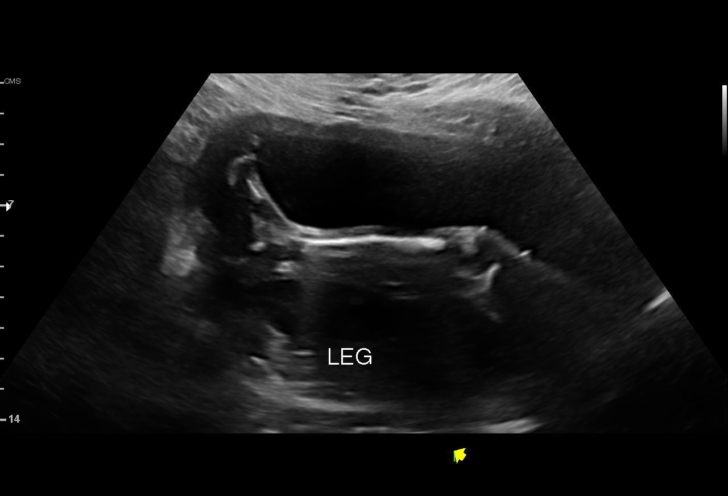
[im 79/119]
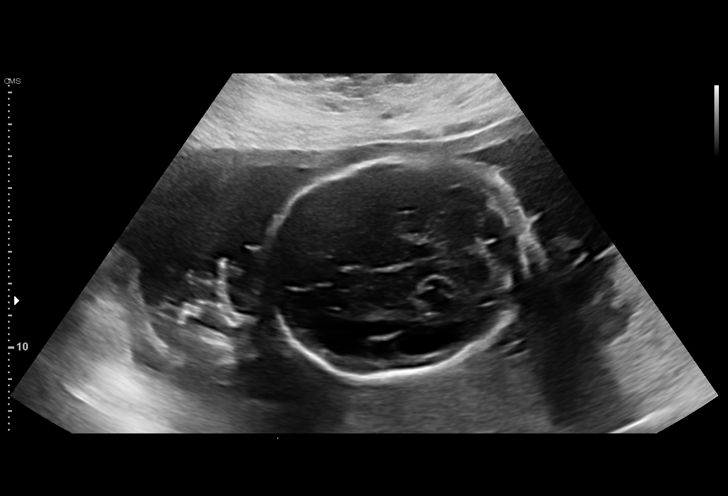
[im 88/119]
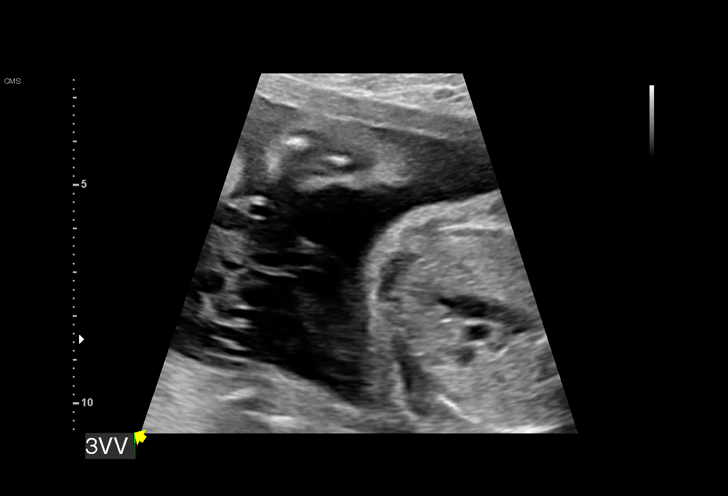
[im 97/119]
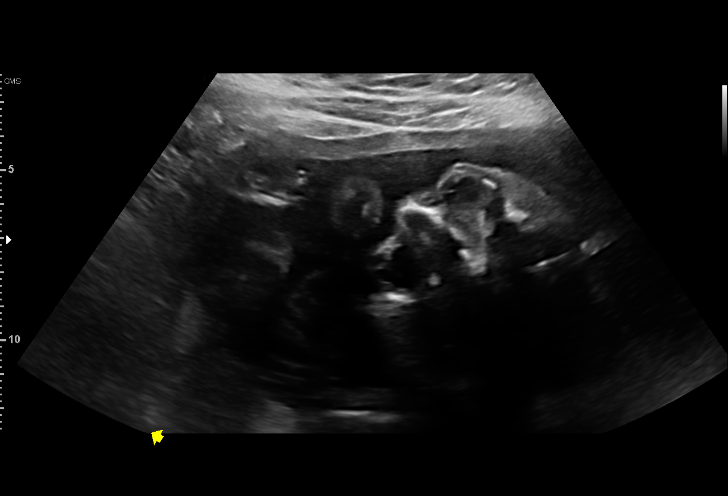
[im 105/119]
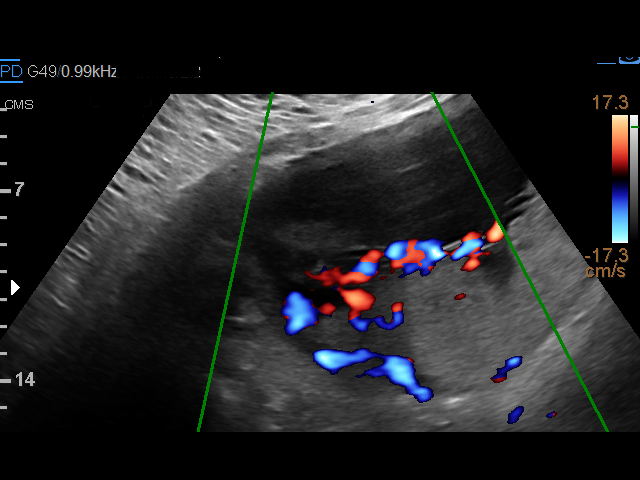
[im 114/119]
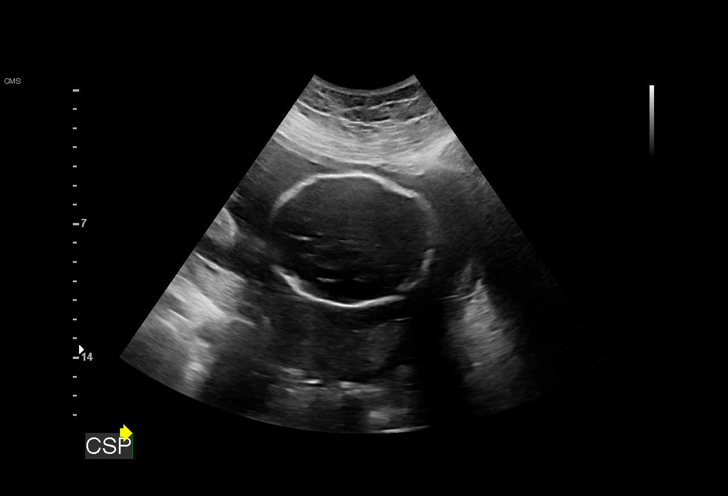

[13 of 28 positions shown; findings below may reference images not displayed]

[REDACTED]

Indications

 26 weeks gestation of pregnancy
 Antenatal screening for malformations
 Velamentous insertion of umbilical cord
 Placenta previa specified as without
 hemorrhage, second trimester
 Obesity complicating pregnancy, second
 trimester (pregravid BMI 40)
 Pregnancy resulting from assisted
 reproductive technology (IVF)
 Low risk NIPS, declined fetal ECHO
Fetal Evaluation

 Num Of Fetuses:         1
 Fetal Heart Rate(bpm):  152
 Cardiac Activity:       Observed
 Presentation:           Breech
 Placenta:               Posterior, low-lying, 0.9 cm from int os
 P. Cord Insertion:      Velamentous insertion

 Amniotic Fluid
 AFI FV:      Within normal limits

                             Largest Pocket(cm)

Biometry

 BPD:      68.5  mm     G. Age:  27w 4d         77  %    CI:        72.44   %    70 - 86
                                                         FL/HC:      17.9   %    18.6 -
 HC:       256   mm     G. Age:  27w 6d         71  %    HC/AC:      1.15        1.04 -
 AC:       222   mm     G. Age:  26w 4d         48  %    FL/BPD:     67.0   %    71 - 87
 FL:       45.9  mm     G. Age:  25w 2d          9  %    FL/AC:      20.7   %    20 - 24
 HUM:      43.4  mm     G. Age:  25w 6d         34  %
 CER:      30.8  mm     G. Age:  26w 5d         64  %
 LV:        6.2  mm
 CM:        6.3  mm

 Est. FW:     918  gm           2 lb     33  %
Gestational Age

 LMP:           45w 0d        Date:  03/19/20                 EDD:   12/24/20
 U/S Today:     26w 6d                                        EDD:   04/30/21
 Best:          26w 3d     Det. By:  D.O. Conception          EDD:   05/03/21
Anatomy

 Cranium:               Appears normal         LVOT:                   Appears normal
 Cavum:                 Appears normal         Aortic Arch:            Appears normal
 Ventricles:            Appears normal         Ductal Arch:            Not well visualized
 Choroid Plexus:        Appears normal         Diaphragm:              Appears normal
 Cerebellum:            Appears normal         Stomach:                Appears normal, left
                                                                       sided
 Posterior Fossa:       Appears normal         Abdomen:                Appears normal
 Nuchal Fold:           Not applicable (>20    Abdominal Wall:         Appears nml (cord
                        wks GA)                                        insert, abd wall)
 Face:                  Appears normal         Cord Vessels:           Appears normal (3
                        (orbits and profile)                           vessel cord)
 Lips:                  Appears normal         Kidneys:                Appear normal
 Palate:                Appears normal         Bladder:                Appears normal
 Thoracic:              Appears normal         Spine:                  Appears normal
 Heart:                 Appears normal         Upper Extremities:      Appears normal
                        (4CH, axis, and
                        situs)
 RVOT:                  Not well visualized    Lower Extremities:      Appears normal

 Other:  Fetus appears to be female. Heels/feet and RIGHT open hand/5th
         digit visualized. Nasal bone visualized. Lenses visualized. Technically
         difficult due to fetal position.
Cervix Uterus Adnexa

 Cervix
 Length:           3.65  cm.
 Normal appearance by transabdominal scan.

 Uterus
 No abnormality visualized.

 Right Ovary
 Not visualized.
 Left Ovary
 Within normal limits.

 Cul De Sac
 No free fluid seen.

 Adnexa
 No abnormality visualized.
Impression

 G1 P0. IVF pregnancy.  Advanced maternal age.  Patient is
 here for a second opinion.  On your office ultrasound,
 velamentous cord insertion was suspected.
 On cell free fetal DNA screening, the risks of fetal
 aneuploidies are not increased.
 We performed fetal anatomical survey.  No markers of
 aneuploidies of fetal structural defects are seen.  Fetal
 biometry is consistent with the previously established dates.
 Amniotic fluid is normal and good fetal activity seen.  We
 confirm the presence of velamentous cord insertion (superior
 end).  Because of suspicion of low-lying placenta/placenta
 previa, we performed a trans-vaginal ultrasound.
 The placental edge is 9 millimeters from the internal os (low-
 lying placenta) but not covering the internal os.
 I explained the finding of low-lying placenta with help of
 ultrasound images and reassured her that in most cases it
 resolves with advancing gestation.  Patient has not had
 vaginal bleeding.
 Velamentous cord insertion: I explained the finding with the
 help of a diagram. I informed her that the location is not near
 the cervix and there is no evidence of vasa previa.
 Velamentous cord insertion can be associated with fetal
 growth restriction and we recommend serial growth scan.
 Generally, they are not associated with adverse outcomes
 including rupture of the vessels (more common with vasa
 previa). Nonreassuring fetal heart trace may be seen during
 labor that may lead to cesarean delivery.
Recommendations

 -Fetal growth assessment in 4 weeks (can be performed at
 your office).
 -An appointment was made for her to return in 8 weeks for
 fetal growth and transvaginal evaluation of placental position.
 -Weekly BPP from 36 weeks' gestation till delivery (IVF
 pregnancy).
                 Less, Mariko

## 2022-10-24 ENCOUNTER — Ambulatory Visit (INDEPENDENT_AMBULATORY_CARE_PROVIDER_SITE_OTHER): Payer: BC Managed Care – PPO

## 2022-10-24 ENCOUNTER — Ambulatory Visit (INDEPENDENT_AMBULATORY_CARE_PROVIDER_SITE_OTHER): Payer: BC Managed Care – PPO | Admitting: Surgical

## 2022-10-24 DIAGNOSIS — M25572 Pain in left ankle and joints of left foot: Secondary | ICD-10-CM

## 2022-10-24 MED ORDER — MELOXICAM 15 MG PO TABS: 15.0000 mg | ORAL_TABLET | Freq: Every day | ORAL | 0 refills | Status: DC

## 2022-10-26 ENCOUNTER — Encounter: Payer: Self-pay | Admitting: Surgical

## 2022-10-26 NOTE — Progress Notes (Signed)
Office Visit Note   Patient: Regina Larsen           Date of Birth: 04/08/1986           MRN: [id number] Visit Date: 10/24/2022 Requested by: No referring provider defined for this encounter. PCP: Patient, No Pcp Per  Subjective: Chief Complaint  Patient presents with   Other     Left foot/ankle pain    HPI: Regina Larsen is a 36 y.o. female who presents to the office reporting left ankle pain for 2 months without any history of injury.  Patient describes anterolateral pain that is worse with walking.  She has had no recent increase in her typical activity.  States that it will rarely cause her to limp at the end of the day.  Will keep her up at night but does not wake her from sleep.  She denies any swelling that she has noticed.  Some days it really does not bother her that much such as in the last couple days have been fairly good for her.  Taking Tylenol and ibuprofen help with pain control.  She has used heat and ice with heat providing more relief.  She is also noticed in the last week in particular a new clicking sensation in the anterior aspect of the ankle.  No locking or instability of the ankle.  She is really not had any issue with this ankle in the past.  No history of prior surgery..                ROS: All systems reviewed are negative as they relate to the chief complaint within the history of present illness.  Patient denies fevers or chills.  Assessment & Plan: Visit Diagnoses:  1. Pain in left ankle and joints of left foot     Plan: Patient is a 36 year old female who presents for evaluation of left ankle pain.  She has increased pain over the last 2 months without any history of injury or increase in her typical activity level.  Posterior pain localizes to the anterolateral aspect of the ankle subjectively but she does have more medial and anterior tenderness on exam today.  She has had some recent increases in mechanical symptoms over the last week with increased  clicking that she is noticed in the ankle but her pain is actually been a little bit better in the last several days.  No significant findings on radiographs today.  After discussion of options, plan to try physical therapy agrees with physical therapy to focus on left ankle range of motion and strengthening exercises.  Will also try Mobic taken daily instead of the intermittent ibuprofen that she has been taking.  Follow-up in 6 weeks for clinical recheck but if she has worsening pain with PT and progressive symptoms, she will call and we can order MRI.  Follow-Up Instructions: No follow-ups on file.   Orders:  Orders Placed This Encounter  Procedures   XR Foot Complete Left   XR Ankle Complete Left   Meds ordered this encounter  Medications   meloxicam (MOBIC) 15 MG tablet    Sig: Take 1 tablet (15 mg total) by mouth daily.    Dispense:  30 tablet    Refill:  0      Procedures: No procedures performed   Clinical Data: No additional findings.  Objective: Vital Signs: There were no vitals taken for this visit.  Physical Exam:  Constitutional: Patient appears well-developed HEENT:  Head: Normocephalic  Eyes:EOM are normal Neck: Normal range of motion Cardiovascular: Normal rate Pulmonary/chest: Effort normal Neurologic: Patient is alert Skin: Skin is warm Psychiatric: Patient has normal mood and affect  Ortho Exam: Ortho exam demonstrates left foot with palpable DP pulse.  Intact dorsiflexion, plantarflexion, inversion, eversion without any significant reproduction of pain.  She does have tenderness over the joint line of the ankle.  Mild tenderness over the medial malleolus.  No tenderness over the lateral malleolus.  Tenderness over the posterior tibialis tendon insertion.  Nontender over the Achilles tendon insertion, Achilles tendon, fifth metatarsal base, peroneal tendon course, Lisfranc complex, forefoot.  No tenderness.  Negative Homans' sign.  She is able to perform  single-leg heel raise without any significant pain.  No flatfoot deformity noted compared with the contralateral side.  No bruising or swelling noted  Specialty Comments:  No specialty comments available.  Imaging: No results found.   PMFS History: Patient Active Problem List   Diagnosis Date Noted   Severe preeclampsia 05/05/2021   Past Medical History:  Diagnosis Date   HSV (herpes simplex virus) infection    Vaginal Pap smear, abnormal     Family History  Problem Relation Age of Onset   Heart disease Mother    Diabetes Father    Obesity Father    Stroke Father    Hypertension Father    Asthma Sister    Stroke Paternal Grandmother    Cancer Paternal Grandfather     Past Surgical History:  Procedure Laterality Date   CESAREAN SECTION N/A 05/07/2021   Procedure: CESAREAN SECTION;  Surgeon: Lyn Henri, MD;  Location: [facility];  Service: Obstetrics;  Laterality: N/A;   KNEE ARTHROSCOPY W/ ACL RECONSTRUCTION  2021   Social History   Occupational History   Not on file  Tobacco Use   Smoking status: Never   Smokeless tobacco: Never  Vaping Use   Vaping Use: Never used  Substance and Sexual Activity   Alcohol use: Not Currently   Drug use: Never   Sexual activity: Not on file

## 2022-11-22 ENCOUNTER — Other Ambulatory Visit: Payer: Self-pay | Admitting: Surgical

## 2022-12-05 ENCOUNTER — Ambulatory Visit: Payer: BC Managed Care – PPO | Admitting: Surgical
